# Patient Record
Sex: Male | Born: 1985 | Race: Black or African American | Hispanic: No | Marital: Single | Smoking: Current every day smoker
Health system: Southern US, Community
[De-identification: ages and names within clinical notes are randomized; demographics above are authoritative.]

## PROBLEM LIST (undated history)

## (undated) DIAGNOSIS — K219 Gastro-esophageal reflux disease without esophagitis: Secondary | ICD-10-CM

## (undated) HISTORY — PX: OTHER SURGICAL HISTORY: SHX169

---
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
## 2005-09-01 ENCOUNTER — Emergency Department (HOSPITAL_COMMUNITY): Admission: EM | Admit: 2005-09-01 | Discharge: 2005-09-01 | Payer: Self-pay | Admitting: Emergency Medicine

## 2008-04-23 ENCOUNTER — Emergency Department (HOSPITAL_COMMUNITY): Admission: EM | Admit: 2008-04-23 | Discharge: 2008-04-23 | Payer: Self-pay | Admitting: Emergency Medicine

## 2009-07-14 ENCOUNTER — Emergency Department (HOSPITAL_COMMUNITY): Admission: EM | Admit: 2009-07-14 | Discharge: 2009-07-14 | Payer: Self-pay | Admitting: Emergency Medicine

## 2011-10-24 ENCOUNTER — Emergency Department (HOSPITAL_COMMUNITY): Payer: Self-pay

## 2011-10-24 ENCOUNTER — Encounter (HOSPITAL_COMMUNITY): Payer: Self-pay | Admitting: Emergency Medicine

## 2011-10-24 ENCOUNTER — Emergency Department (HOSPITAL_COMMUNITY)
Admission: EM | Admit: 2011-10-24 | Discharge: 2011-10-24 | Disposition: A | Discharge status: Home / Self Care | Payer: Self-pay | Attending: Emergency Medicine | Admitting: Emergency Medicine

## 2011-10-24 DIAGNOSIS — F17200 Nicotine dependence, unspecified, uncomplicated: Secondary | ICD-10-CM | POA: Insufficient documentation

## 2011-10-24 DIAGNOSIS — Z79899 Other long term (current) drug therapy: Secondary | ICD-10-CM | POA: Insufficient documentation

## 2011-10-24 DIAGNOSIS — R112 Nausea with vomiting, unspecified: Secondary | ICD-10-CM | POA: Insufficient documentation

## 2011-10-24 DIAGNOSIS — R197 Diarrhea, unspecified: Secondary | ICD-10-CM | POA: Insufficient documentation

## 2011-10-24 DIAGNOSIS — J40 Bronchitis, not specified as acute or chronic: Secondary | ICD-10-CM | POA: Insufficient documentation

## 2011-10-24 MED ORDER — DOXYCYCLINE HYCLATE 100 MG PO TABS: 100.0000 mg | ORAL_TABLET | Freq: Two times a day (BID) | ORAL | Status: AC

## 2011-10-24 MED ORDER — ONDANSETRON HCL 4 MG PO TABS: 4.0000 mg | ORAL_TABLET | Freq: Three times a day (TID) | ORAL | Status: AC | PRN

## 2011-10-24 MED ORDER — ONDANSETRON 4 MG PO TBDP
8.0000 mg | ORAL_TABLET | Freq: Once | ORAL | Status: AC
  Administered 2011-10-24: 8 mg via ORAL
  Filled 2011-10-24: qty 2

## 2011-10-24 MED ORDER — DOXYCYCLINE HYCLATE 100 MG PO TABS
100.0000 mg | ORAL_TABLET | Freq: Once | ORAL | Status: AC
  Administered 2011-10-24: 100 mg via ORAL
  Filled 2011-10-24: qty 1

## 2011-10-24 NOTE — ED Provider Notes (Signed)
History  This chart was scribed for Flint Melter, MD by Bennett Scrape. This patient was seen in room STRE5/STRE5 and the patient's care was started at 4:18PM.  CSN: [id number]  Arrival date & time 10/24/11  1519   First MD Initiated Contact with Patient 10/24/11 1618      Chief Complaint  Patient presents with  . Cough    The history is provided by the patient. No language interpreter was used.    Joel Ross is a 26 y.o. male who presents to the Emergency Department complaining of 3 days of gradual onset, gradually worsening, constant productive cough of thick yellow mucus with associated flank pain, diaphoresis, SOB, chills, nausea, emesis, diarrhea and sore throat. The flank pain is worse with coughing. The diarrhea was described as green. Pt states that the last episode of emesis occurred yesterday. Pt states that he had similar but milder symptoms for 2 days approximately 2 weeks ago. He reports trying OTC medications without improvement. He states that he has not been on antibiotics for the symptoms. He denies having any sick contacts with similar symptoms. He has a h/o childhood asthma. He is a current everyday smoker and occasional alcohol user.  No PCP  Past Medical History  Diagnosis Date  . Asthma     childhood    History reviewed. No pertinent past surgical history.  History reviewed. No pertinent family history.  History  Substance Use Topics  . Smoking status: Current Everyday Smoker -- 0.5 packs/day  . Smokeless tobacco: Not on file  . Alcohol Use: Yes     occasion      Review of Systems  A complete 10 system review of systems was obtained and all systems are negative except as noted in the HPI and PMH.   Allergies  Review of patient's allergies indicates no known allergies.  Home Medications   Current Outpatient Rx  Name Route Sig Dispense Refill  . IBUPROFEN 200 MG PO TABS Oral Take 400 mg by mouth daily as needed. For pain    .  DOXYCYCLINE HYCLATE 100 MG PO TABS Oral Take 1 tablet (100 mg total) by mouth 2 (two) times daily. 14 tablet 0  . ONDANSETRON HCL 4 MG PO TABS Oral Take 1 tablet (4 mg total) by mouth every 8 (eight) hours as needed for nausea. 12 tablet 0    Triage Vitals: BP 143/81  Pulse 89  Temp(Src) 98.8 F (37.1 C) (Oral)  Resp 16  SpO2 98%  Physical Exam  Nursing note and vitals reviewed. Constitutional: He is oriented to person, place, and time. He appears well-developed and well-nourished.       Diaphoretic  HENT:  Head: Normocephalic and atraumatic.  Right Ear: External ear normal.  Left Ear: External ear normal.  Eyes: Conjunctivae and EOM are normal. Pupils are equal, round, and reactive to light.  Neck: Normal range of motion and phonation normal. Neck supple.  Cardiovascular: Normal rate, regular rhythm, normal heart sounds and intact distal pulses.   Pulmonary/Chest: Effort normal and breath sounds normal. He exhibits no bony tenderness.       No costovertebral tenderness  Abdominal: Soft. Normal appearance. There is no tenderness.  Musculoskeletal: Normal range of motion.       Mild lumbar tenderness  Neurological: He is alert and oriented to person, place, and time. He has normal strength. No cranial nerve deficit or sensory deficit. He exhibits normal muscle tone. Coordination normal.  Skin: Skin is warm, dry and  intact.  Psychiatric: He has a normal mood and affect. His behavior is normal. Judgment and thought content normal.    ED Course  Procedures (including critical care time)  DIAGNOSTIC STUDIES: Oxygen Saturation is 98% on room air, normal by my interpretation.    COORDINATION OF CARE: 4:28PM-Informed pt of negative chest x-ray. Discussed discharge plan of antinausea medication and antibiotics with pt and pt agreed.   5:32PM-Pt rechecked and states that he feels better. Pt states that he was able to drink some water after the antianausea medications. Will discharge pt  with a work note.  Labs Reviewed - No data to display Dg Chest 2 View  10/24/2011  *RADIOLOGY REPORT*  Clinical Data: Cough  CHEST - 2 VIEW  Comparison: None.  Findings: Heart is normal in size.  Lungs are well aerated and clear.  Mild bronchitic changes.  No pneumothorax and no pleural fluid.  IMPRESSION: No active cardiopulmonary disease.  Original Report Authenticated By: Donavan Burnet, M.D.     1. Bronchitis   2. Nausea & vomiting       MDM  Cough with Bronchospasm, eval. C/w bronchitis; doubt PNE, PE, ACS, metabolic instability. Pt stable for d/c     I personally performed the services described in this documentation, which was scribed in my presence. The recorded information has been reviewed and considered.     Flint Melter, MD 10/26/11 [phone]

## 2011-10-24 NOTE — Discharge Instructions (Signed)
Bronchitis Bronchitis is the body's way of reacting to injury and/or infection (inflammation) of the bronchi. Bronchi are the air tubes that extend from the windpipe into the lungs. If the inflammation becomes severe, it may cause shortness of breath. CAUSES  Inflammation may be caused by:  A virus.   Germs (bacteria).   Dust.   Allergens.   Pollutants and many other irritants.  The cells lining the bronchial tree are covered with tiny hairs (cilia). These constantly beat upward, away from the lungs, toward the mouth. This keeps the lungs free of pollutants. When these cells become too irritated and are unable to do their job, mucus begins to develop. This causes the characteristic cough of bronchitis. The cough clears the lungs when the cilia are unable to do their job. Without either of these protective mechanisms, the mucus would settle in the lungs. Then you would develop pneumonia. Smoking is a common cause of bronchitis and can contribute to pneumonia. Stopping this habit is the single most important thing you can do to help yourself. TREATMENT   Your caregiver may prescribe an antibiotic if the cough is caused by bacteria. Also, medicines that open up your airways make it easier to breathe. Your caregiver may also recommend or prescribe an expectorant. It will loosen the mucus to be coughed up. Only take over-the-counter or prescription medicines for pain, discomfort, or fever as directed by your caregiver.   Removing whatever causes the problem (smoking, for example) is critical to preventing the problem from getting worse.   Cough suppressants may be prescribed for relief of cough symptoms.   Inhaled medicines may be prescribed to help with symptoms now and to help prevent problems from returning.   For those with recurrent (chronic) bronchitis, there may be a need for steroid medicines.  SEEK IMMEDIATE MEDICAL CARE IF:   During treatment, you develop more pus-like mucus  (purulent sputum).   You have a fever.   Your baby is older than 3 months with a rectal temperature of 102 F (38.9 C) or higher.   Your baby is 66 months old or younger with a rectal temperature of 100.4 F (38 C) or higher.   You become progressively more ill.   You have increased difficulty breathing, wheezing, or shortness of breath.  It is necessary to seek immediate medical care if you are elderly or sick from any other disease. MAKE SURE YOU:   Understand these instructions.   Will watch your condition.   Will get help right away if you are not doing well or get worse.  Document Released: 05/26/2005 Document Revised: 05/15/2011 Document Reviewed: 04/04/2008 [facility] Patient Information 2012 [place], [place].Clear Liquid Diet The clear liquid dietconsists of foods that are liquid or will become liquid at room temperature.You should be able to see through the liquid and beverages. Examples of foods allowed on a clear liquid diet include fruit juice, broth or bouillon, gelatin, or frozen ice pops. The purpose of this diet is to provide necessary fluid, electrolytes such as sodium and potassium, and energy to keep the body functioning during times when you are not able to consume a regular diet.A clear liquid diet should not be continued for long periods of time as it is not nutritionally adequate.  REASONS FOR USING A CLEAR LIQUID DIET  In sudden onset (acute) conditions for a patient before or after surgery.   As the first step in oral feeding.   For fluid and electrolyte replacement in diarrheal diseases.  As a diet before certain medical tests are performed.  ADEQUACY The clear liquid diet is adequate only in ascorbic acid, according to the Recommended Dietary Allowances of the [employer]. CHOOSING FOODS Breads and Starches  Allowed:  None are allowed.   Avoid: All are avoided.  Vegetables  Allowed:  Strained tomato or vegetable juice.   Avoid:  Any others.  Fruit  Allowed:  Strained fruit juices and fruit drinks. Include 1 serving of citrus or vitamin C-enriched fruit juice daily.   Avoid: Any others.  Meat and Meat Substitutes  Allowed:  None are allowed.   Avoid: All are avoided.  Milk  Allowed:  None are allowed.   Avoid: All are avoided.  Soups and Combination Foods  Allowed:  Clear bouillon, broth, or strained broth-based soups.   Avoid: Any others.  Desserts and Sweets  Allowed:  Sugar, honey. High protein gelatin. Flavored gelatin, ices, or frozen ice pops that do not contain milk.   Avoid: Any others.  Fats and Oils  Allowed:  None are allowed.   Avoid: All are avoided.  Beverages  Allowed: Cereal beverages, coffee (regular or decaffeinated), tea, or soda at the discretion of your caregiver.   Avoid: Any others.  Condiments  Allowed:  Iodized salt.   Avoid: Any others, including pepper.  Supplements  Allowed:  Liquid nutrition beverages.   Avoid: Any others that contain lactose or fiber.  SAMPLE MEAL PLAN Breakfast  4 oz (120 mL) strained orange juice.    to 1 cup (125 to 250 mL) gelatin (plain or fortified).   1 cup (250 mL) beverage (coffee or tea).   Sugar, if desired.  Midmorning Snack   cup (125 mL) gelatin (plain or fortified).  Lunch  1 cup (250 mL) broth or consomm.   4 oz (120 mL) strained grapefruit juice.    cup (125 mL) gelatin (plain or fortified).   1 cup (250 mL) beverage (coffee or tea).   Sugar, if desired.  Midafternoon Snack   cup (125 mL) fruit ice.    cup (125 mL) strained fruit juice.  Dinner  1 cup (250 mL) broth or consomm.    cup (125 mL) cranberry juice.    cup (125 mL) flavored gelatin (plain or fortified).   1 cup (250 mL) beverage (coffee or tea).   Sugar, if desired.  Evening Snack  4 oz (120 mL) strained apple juice (vitamin C-fortified).    cup (125 mL) flavored gelatin (plain or fortified).  Document Released:  05/26/2005 Document Revised: 05/15/2011 Document Reviewed: 08/23/2010 [facility] Patient Information 2012 [place], [place].

## 2011-10-24 NOTE — ED Notes (Signed)
Reports 2 weeks ago, had what thought was common cold; on Tuesday, has started to get worse; reports productive cough-thick yellow, SOB, chills, sore throat; pt has tried OTC meds without relief, has not been on abx

## 2013-01-25 ENCOUNTER — Emergency Department (HOSPITAL_COMMUNITY)
Admission: EM | Admit: 2013-01-25 | Discharge: 2013-01-25 | Disposition: A | Discharge status: Home / Self Care | Payer: Self-pay | Attending: Emergency Medicine | Admitting: Emergency Medicine

## 2013-01-25 ENCOUNTER — Emergency Department (HOSPITAL_COMMUNITY): Payer: Self-pay

## 2013-01-25 ENCOUNTER — Encounter (HOSPITAL_COMMUNITY): Payer: Self-pay | Admitting: Radiology

## 2013-01-25 DIAGNOSIS — K611 Rectal abscess: Secondary | ICD-10-CM

## 2013-01-25 DIAGNOSIS — K612 Anorectal abscess: Secondary | ICD-10-CM | POA: Insufficient documentation

## 2013-01-25 DIAGNOSIS — K6289 Other specified diseases of anus and rectum: Secondary | ICD-10-CM | POA: Insufficient documentation

## 2013-01-25 DIAGNOSIS — F17200 Nicotine dependence, unspecified, uncomplicated: Secondary | ICD-10-CM | POA: Insufficient documentation

## 2013-01-25 DIAGNOSIS — J45909 Unspecified asthma, uncomplicated: Secondary | ICD-10-CM | POA: Insufficient documentation

## 2013-01-25 DIAGNOSIS — Z79899 Other long term (current) drug therapy: Secondary | ICD-10-CM | POA: Insufficient documentation

## 2013-01-25 LAB — CBC WITH DIFFERENTIAL/PLATELET
Eosinophils Absolute: 0.1 10*3/uL (ref 0.0–0.7)
Eosinophils Relative: 1 % (ref 0–5)
Hemoglobin: 14 g/dL (ref 13.0–17.0)
Lymphocytes Relative: 17 % (ref 12–46)
Lymphs Abs: 1.8 10*3/uL (ref 0.7–4.0)
MCH: 30.4 pg (ref 26.0–34.0)
MCV: 90.7 fL (ref 78.0–100.0)
Monocytes Relative: 12 % (ref 3–12)
Neutrophils Relative %: 70 % (ref 43–77)
Platelets: 252 10*3/uL (ref 150–400)
RBC: 4.6 MIL/uL (ref 4.22–5.81)
WBC: 10.7 10*3/uL — ABNORMAL HIGH (ref 4.0–10.5)

## 2013-01-25 LAB — BASIC METABOLIC PANEL
BUN: 14 mg/dL (ref 6–23)
CO2: 29 mEq/L (ref 19–32)
Glucose, Bld: 112 mg/dL — ABNORMAL HIGH (ref 70–99)
Potassium: 3.7 mEq/L (ref 3.5–5.1)
Sodium: 138 mEq/L (ref 135–145)

## 2013-01-25 MED ORDER — SULFAMETHOXAZOLE-TRIMETHOPRIM 800-160 MG PO TABS: 1.0000 | ORAL_TABLET | Freq: Two times a day (BID) | ORAL | Status: DC

## 2013-01-25 MED ORDER — OXYCODONE-ACETAMINOPHEN 5-325 MG PO TABS
2.0000 | ORAL_TABLET | Freq: Once | ORAL | Status: AC
  Administered 2013-01-25: 2 via ORAL
  Filled 2013-01-25: qty 2

## 2013-01-25 MED ORDER — HYDROCODONE-ACETAMINOPHEN 5-325 MG PO TABS: 1.0000 | ORAL_TABLET | ORAL | Status: DC | PRN

## 2013-01-25 MED ORDER — DOCUSATE SODIUM 100 MG PO CAPS: 100.0000 mg | ORAL_CAPSULE | Freq: Two times a day (BID) | ORAL | Status: DC

## 2013-01-25 MED ORDER — IOHEXOL 300 MG/ML  SOLN
50.0000 mL | Freq: Once | INTRAMUSCULAR | Status: AC | PRN
  Administered 2013-01-25: 50 mL via INTRAVENOUS

## 2013-01-25 MED ORDER — IOHEXOL 300 MG/ML  SOLN
100.0000 mL | Freq: Once | INTRAMUSCULAR | Status: AC | PRN
  Administered 2013-01-25: 100 mL via INTRAVENOUS

## 2013-01-25 NOTE — ED Provider Notes (Signed)
CSN: [id number]     Arrival date & time 01/25/13  1015 History     First MD Initiated Contact with Patient 01/25/13 1052     Chief Complaint  Patient presents with  . Abscess    HPI Patient reports worsening rectal pain over the past 3-4 days.  He denies fevers or chills.  No history of abscesses.  His pain is mild to moderate in severity.  Nothing improves his pain.  No nausea or vomiting.  No abdominal pain.  No melena or hematochezia.   Past Medical History  Diagnosis Date  . Asthma     childhood   No past surgical history on file. No family history on file. History  Substance Use Topics  . Smoking status: Current Every Day Smoker -- 0.50 packs/day  . Smokeless tobacco: Not on file  . Alcohol Use: Yes     Comment: occasion    Review of Systems  All other systems reviewed and are negative.    Allergies  Review of patient's allergies indicates no known allergies.  Home Medications   Current Outpatient Rx  Name  Route  Sig  Dispense  Refill  . phenylephrine-shark liver oil-mineral oil-petrolatum (PREPARATION H) 0.25-3-14-71.9 % rectal ointment   Rectal   Place 1 application rectally 2 (two) times daily as needed for hemorrhoids.         Marland Kitchen docusate sodium (COLACE) 100 MG capsule   Oral   Take 1 capsule (100 mg total) by mouth every 12 (twelve) hours.   60 capsule   0   . HYDROcodone-acetaminophen (NORCO/VICODIN) 5-325 MG per tablet   Oral   Take 1 tablet by mouth every 4 (four) hours as needed for pain.   15 tablet   0   . sulfamethoxazole-trimethoprim (SEPTRA DS) 800-160 MG per tablet   Oral   Take 1 tablet by mouth 2 (two) times daily.   28 tablet   0    BP 138/79  Pulse 76  Temp(Src) 98.8 F (37.1 C) (Oral)  SpO2 100% Physical Exam  Nursing note and vitals reviewed. Constitutional: He is oriented to person, place, and time. He appears well-developed and well-nourished.  HENT:  Head: Normocephalic and atraumatic.  Eyes: EOM are normal.   Neck: Normal range of motion.  Cardiovascular: Normal rate, regular rhythm, normal heart sounds and intact distal pulses.   Pulmonary/Chest: Effort normal and breath sounds normal.  Abdominal: Soft.  Genitourinary:  Perirectal abscess at approximately 9:00.  Pointing with new purulent drainage.  No surrounding erythema.  Rectal exam performed without significant intrarectal component.  Musculoskeletal: Normal range of motion.  Neurological: He is alert and oriented to person, place, and time.  Skin: Skin is warm and dry.  Psychiatric: He has a normal mood and affect. Judgment normal.    ED Course   Procedures (including critical care time)  Labs Reviewed  CBC WITH DIFFERENTIAL - Abnormal; Notable for the following:    WBC 10.7 (*)    Monocytes Absolute 1.3 (*)    All other components within normal limits  BASIC METABOLIC PANEL - Abnormal; Notable for the following:    Glucose, Bld 112 (*)    GFR calc non Af Amer 80 (*)    All other components within normal limits   Ct Pelvis W Contrast  01/25/2013   *RADIOLOGY REPORT*  Clinical Data:  Evaluate perirectal abscess, rectal pain.  CT PELVIS WITH CONTRAST  Technique:  Multidetector CT imaging of the pelvis was performed using  the standard protocol following the bolus administration of intravenous contrast.  Contrast: 50mL OMNIPAQUE IOHEXOL 300 MG/ML  SOLN, OMNIPAQUE IOHEXOL 300 MG/ML  SOLN  Comparison:   None.  Findings:  Visualized bowel is unremarkable.  Appendix is normal. No pathologically enlarged lymph nodes.  No free fluid.  There is mild eccentric thickening of the perirectal soft tissues, on the right (series 2, image 43).  Possible focal low attenuation in association, measuring 1.2 cm (image 46) stranding is seen in the subcutaneous fat of the medial right buttock.  No worrisome lytic or sclerotic lesions.  IMPRESSION: Asymmetric thickening and possible tiny abscess involving the right perirectal soft tissues.   Original  Report Authenticated By: Leanna Battles, M.D.   I personally reviewed the imaging tests through PACS system I reviewed available ER/hospitalization records through the EMR   1. Perirectal abscess     MDM  The abscess is draining at this time.  No indication for additional incision and drainage.  All the purulent drainage was of the abscess.  The patient be placed on antibiotics.  Home with pain medication.  Home with instructions for warm water soaks.  He understands return to the ER for new or worsening symptoms.  Lyanne Co, MD 01/25/13 [phone]

## 2013-01-25 NOTE — ED Notes (Signed)
Pt has an abscess on his right butt cheek. Pt has never had an abscess her in the past. Onset was 4 days ago.

## 2013-01-25 NOTE — ED Provider Notes (Signed)
MSE was initiated and I personally evaluated the patient and placed orders (if any) at  11:14 AM on January 25, 2013.  The patient appears stable so that the remainder of the MSE may be completed by another provider.  Patient presents emergency department with chief complaint of abscess. He states that he noticed an abscess approximately 4 days ago. He states that he thought it was a hemorrhoid first, and has been using Preparation H with no relief. He denies fevers, chills, nausea, vomiting. He endorses some to pain, but is worsened with sitting and walking. He has never had an abscess before per  PE: Gen: A&O x4 HEENT: PERRL, EOM intact CHEST: RRR, no m/r/g LUNGS: CTAB, no w/r/r ABD: BS x 4, ND/NT GU: Right-sided perirectal abscess, approximately 2 x 2 centimeters EXT: No edema, strong peripheral pulses NEURO: Sensation and strength intact bilaterally  Plan: Moved to acute side, will order basic labs, and CT pelvis to further evaluate perirectal abscess.   Roxy Horseman, PA-C 01/25/13 1115

## 2013-01-25 NOTE — ED Provider Notes (Signed)
Medical screening examination/treatment/procedure(s) were conducted as a shared visit with non-physician practitioner(s) and myself.  I personally evaluated the patient during the encounter  Please see my H and P  Lyanne Co, MD 01/25/13 1139

## 2013-01-25 NOTE — ED Notes (Signed)
Bed: WA08 Expected date:  Expected time:  Means of arrival:  Comments: 

## 2013-11-04 ENCOUNTER — Encounter (HOSPITAL_COMMUNITY)
Admission: EM | Disposition: A | Discharge status: Home / Self Care | Payer: Self-pay | Source: Home / Self Care | Attending: Emergency Medicine

## 2013-11-04 ENCOUNTER — Encounter (HOSPITAL_COMMUNITY): Payer: Self-pay | Admitting: Emergency Medicine

## 2013-11-04 ENCOUNTER — Emergency Department (HOSPITAL_COMMUNITY)
Admission: EM | Admit: 2013-11-04 | Discharge: 2013-11-04 | Disposition: A | Discharge status: Home / Self Care | Payer: Self-pay | Attending: Emergency Medicine | Admitting: Emergency Medicine

## 2013-11-04 ENCOUNTER — Telehealth: Payer: Self-pay | Admitting: Internal Medicine

## 2013-11-04 ENCOUNTER — Emergency Department (HOSPITAL_COMMUNITY): Payer: Self-pay

## 2013-11-04 DIAGNOSIS — Z79899 Other long term (current) drug therapy: Secondary | ICD-10-CM | POA: Insufficient documentation

## 2013-11-04 DIAGNOSIS — T18128A Food in esophagus causing other injury, initial encounter: Secondary | ICD-10-CM

## 2013-11-04 DIAGNOSIS — F17200 Nicotine dependence, unspecified, uncomplicated: Secondary | ICD-10-CM | POA: Insufficient documentation

## 2013-11-04 DIAGNOSIS — Y990 Civilian activity done for income or pay: Secondary | ICD-10-CM | POA: Insufficient documentation

## 2013-11-04 DIAGNOSIS — Y9289 Other specified places as the place of occurrence of the external cause: Secondary | ICD-10-CM | POA: Insufficient documentation

## 2013-11-04 DIAGNOSIS — R109 Unspecified abdominal pain: Secondary | ICD-10-CM | POA: Insufficient documentation

## 2013-11-04 DIAGNOSIS — K222 Esophageal obstruction: Secondary | ICD-10-CM

## 2013-11-04 DIAGNOSIS — IMO0002 Reserved for concepts with insufficient information to code with codable children: Secondary | ICD-10-CM | POA: Insufficient documentation

## 2013-11-04 DIAGNOSIS — T18108A Unspecified foreign body in esophagus causing other injury, initial encounter: Secondary | ICD-10-CM | POA: Insufficient documentation

## 2013-11-04 DIAGNOSIS — R112 Nausea with vomiting, unspecified: Secondary | ICD-10-CM | POA: Insufficient documentation

## 2013-11-04 DIAGNOSIS — Y9389 Activity, other specified: Secondary | ICD-10-CM | POA: Insufficient documentation

## 2013-11-04 DIAGNOSIS — J45909 Unspecified asthma, uncomplicated: Secondary | ICD-10-CM | POA: Insufficient documentation

## 2013-11-04 LAB — CBC
HEMATOCRIT: 42.5 % (ref 39.0–52.0)
Hemoglobin: 15.4 g/dL (ref 13.0–17.0)
MCH: 31.8 pg (ref 26.0–34.0)
MCHC: 36.2 g/dL — ABNORMAL HIGH (ref 30.0–36.0)
MCV: 87.8 fL (ref 78.0–100.0)
PLATELETS: 251 10*3/uL (ref 150–400)
RBC: 4.84 MIL/uL (ref 4.22–5.81)
RDW: 12.9 % (ref 11.5–15.5)
WBC: 9.8 10*3/uL (ref 4.0–10.5)

## 2013-11-04 LAB — BASIC METABOLIC PANEL
BUN: 16 mg/dL (ref 6–23)
CHLORIDE: 97 meq/L (ref 96–112)
CO2: 25 mEq/L (ref 19–32)
Calcium: 9.7 mg/dL (ref 8.4–10.5)
Creatinine, Ser: 1.39 mg/dL — ABNORMAL HIGH (ref 0.50–1.35)
GFR calc non Af Amer: 67 mL/min — ABNORMAL LOW (ref >90)
GFR, EST AFRICAN AMERICAN: 77 mL/min — AB (ref >90)
Glucose, Bld: 101 mg/dL — ABNORMAL HIGH (ref 70–99)
POTASSIUM: 3.4 meq/L — AB (ref 3.7–5.3)
SODIUM: 140 meq/L (ref 137–147)

## 2013-11-04 LAB — I-STAT TROPONIN, ED
TROPONIN I, POC: 0 ng/mL (ref 0.00–0.08)
TROPONIN I, POC: 0 ng/mL (ref 0.00–0.08)

## 2013-11-04 SURGERY — EGD (ESOPHAGOGASTRODUODENOSCOPY)
Anesthesia: Moderate Sedation

## 2013-11-04 MED ORDER — NITROGLYCERIN 0.4 MG SL SUBL
0.4000 mg | SUBLINGUAL_TABLET | SUBLINGUAL | Status: DC | PRN
  Administered 2013-11-04: 0.4 mg via SUBLINGUAL

## 2013-11-04 MED ORDER — ONDANSETRON HCL 4 MG/2ML IJ SOLN
4.0000 mg | Freq: Once | INTRAMUSCULAR | Status: AC
  Administered 2013-11-04: 4 mg via INTRAVENOUS
  Filled 2013-11-04: qty 2

## 2013-11-04 MED ORDER — ONDANSETRON 4 MG PO TBDP
8.0000 mg | ORAL_TABLET | Freq: Once | ORAL | Status: AC
  Administered 2013-11-04: 8 mg via ORAL

## 2013-11-04 MED ORDER — GLUCAGON HCL RDNA (DIAGNOSTIC) 1 MG IJ SOLR
1.0000 mg | Freq: Once | INTRAMUSCULAR | Status: AC
  Administered 2013-11-04: 1 mg via INTRAVENOUS
  Filled 2013-11-04: qty 1

## 2013-11-04 MED ORDER — ONDANSETRON 4 MG PO TBDP
ORAL_TABLET | ORAL | Status: AC
  Filled 2013-11-04: qty 2

## 2013-11-04 MED ORDER — ONDANSETRON HCL 4 MG/2ML IJ SOLN
INTRAMUSCULAR | Status: AC
  Filled 2013-11-04: qty 2

## 2013-11-04 MED ORDER — FENTANYL CITRATE 0.05 MG/ML IJ SOLN
50.0000 ug | Freq: Once | INTRAMUSCULAR | Status: AC
  Administered 2013-11-04: 50 ug via INTRAVENOUS
  Filled 2013-11-04: qty 2

## 2013-11-04 MED ORDER — ASPIRIN 81 MG PO CHEW
324.0000 mg | CHEWABLE_TABLET | Freq: Once | ORAL | Status: AC
  Administered 2013-11-04: 324 mg via ORAL
  Filled 2013-11-04: qty 4

## 2013-11-04 NOTE — ED Notes (Addendum)
C/o epigastric pain, constant, not changing, like pressure building up in mid upper chest & throat, "feels like something stuck, gas or food ", no dyspnea noted, airway intact, handling secretions, no drooling, speech clear, reports vomiting food particles and liquid, last emesis in w/r, has kept down liquids since evening meal of [place] steak.

## 2013-11-04 NOTE — ED Provider Notes (Signed)
CSN: [id number]     Arrival date & time 11/04/13  0113 History   First MD Initiated Contact with Patient 11/04/13 0545     Chief Complaint  Patient presents with  . Chest Pain     (Consider location/radiation/quality/duration/timing/severity/associated sxs/prior Treatment) HPI Comments: Pt has no medical hx. States that he had some food while at work yday afternoon, and soon after started having some discomfort in his throat. He feels that some food is stuck in his food. He is having some trouble swallowing, and when he drinks water, few minutes later he will spit it out. He also has heart burn like feeling. No hx of food impaction in the past. Breathing is normal.  Patient is a 28 y.o. male presenting with chest pain. The history is provided by the patient.  Chest Pain Associated symptoms: abdominal pain, nausea and vomiting   Associated symptoms: no cough and no shortness of breath     Past Medical History  Diagnosis Date  . Asthma     childhood   History reviewed. No pertinent past surgical history. No family history on file. History  Substance Use Topics  . Smoking status: Current Every Day Smoker -- 0.50 packs/day  . Smokeless tobacco: Not on file  . Alcohol Use: Yes     Comment: occasion    Review of Systems  Constitutional: Negative for activity change and appetite change.  Respiratory: Negative for cough and shortness of breath.   Cardiovascular: Negative for chest pain.  Gastrointestinal: Positive for nausea, vomiting and abdominal pain.  Genitourinary: Negative for dysuria.      Allergies  Review of patient's allergies indicates no known allergies.  Home Medications   Prior to Admission medications   Medication Sig Start Date End Date Taking? Authorizing Provider  phenylephrine-shark liver oil-mineral oil-petrolatum (PREPARATION H) 0.25-3-14-71.9 % rectal ointment Place 1 application rectally 2 (two) times daily as needed for hemorrhoids.   Yes Historical  Provider, MD   BP 126/54  Pulse 67  Temp(Src) 98.2 F (36.8 C) (Oral)  Resp 11  Ht 5\' 5"  (1.651 m)  Wt 190 lb (86.183 kg)  BMI 31.62 kg/m2  SpO2 99% Physical Exam  Nursing note and vitals reviewed. Constitutional: He is oriented to person, place, and time. He appears well-developed.  HENT:  Head: Normocephalic and atraumatic.  Eyes: Conjunctivae and EOM are normal. Pupils are equal, round, and reactive to light.  Neck: Normal range of motion. Neck supple.  Cardiovascular: Normal rate and regular rhythm.   Pulmonary/Chest: Effort normal and breath sounds normal.  Abdominal: Soft. Bowel sounds are normal. He exhibits no distension. There is no tenderness. There is no rebound and no guarding.  Neurological: He is alert and oriented to person, place, and time.  Skin: Skin is warm.    ED Course  Procedures (including critical care time) Labs Review Labs Reviewed  CBC - Abnormal; Notable for the following:    MCHC 36.2 (*)    All other components within normal limits  BASIC METABOLIC PANEL - Abnormal; Notable for the following:    Potassium 3.4 (*)    Glucose, Bld 101 (*)    Creatinine, Ser 1.39 (*)    GFR calc non Af Amer 67 (*)    GFR calc Af Amer 77 (*)    All other components within normal limits  I-STAT TROPOININ, ED  Rosezena SensorI-STAT TROPOININ, ED    Imaging Review Dg Chest 2 View  11/04/2013   CLINICAL DATA:  Chest pain, heartburn,  nausea, vomiting, history asthma  EXAM: CHEST  2 VIEW  COMPARISON:  10/24/2011  FINDINGS: Normal heart size, mediastinal contours, and pulmonary vascularity.  Lungs clear.  No pneumothorax.  Bones unremarkable.  IMPRESSION: Normal exam.   Electronically Signed   By: Ulyses Southward M.D.   On: 11/04/2013 02:23     EKG Interpretation   Date/Time:  Friday Nov 04 2013 05:23:13 EDT Ventricular Rate:  77 PR Interval:  139 QRS Duration: 81 QT Interval:  399 QTC Calculation: 452 R Axis:   87 Text Interpretation:  Sinus rhythm Normal intervals normal  axis Confirmed  by Norman Piacentini, MD, Vidur Knust ([id number]) on 11/04/2013 6:21:22 AM      MDM   Final diagnoses:  Esophageal obstruction due to food impaction    Pt comes in with cc of heart burn and feeling that food is stuck in his throat since yday afternoon. GLuicagon and nitro given - no real relief.  GI called - and patient was given a glass of water - that he was able to keep down. GI team wants to see patient in the clinic, for suspected partial impaction. Pt discharged.  Derwood Kaplan, MD 11/04/13 [phone]

## 2013-11-04 NOTE — ED Notes (Signed)
Patient with onset of feeling swelling in his chest and throat.  He states he is having chest pain that increases with swallowing.  Patient with emesis x 5.  Patient admits to etoh and marijuana.  Patient denies taking any pain meds prior to arrival.

## 2013-11-04 NOTE — Telephone Encounter (Signed)
History of possible food impaction. Foreign body sensation in lower chest but when I had ED MD give him water it passed and he did not regurgitate.  Told to dc and place on PPI  We would call and arrange f/u for later today or Monday in our office.

## 2013-11-04 NOTE — ED Notes (Signed)
Patient transported to Endo 

## 2013-11-04 NOTE — ED Notes (Signed)
Dr. Nanavati at [facility] ?

## 2013-11-04 NOTE — Discharge Instructions (Signed)
Please don't eat any solid food. Expect a call from the GI doctors soon, for a possible urgent endoscopy. If they dont call you by noon - call [phone], and inform them that the GI doctor had asked you to be seen urgently when you came to the ER.   Swallowed Foreign Body, Adult You have swallowed an object (foreign body). Once the foreign body has passed through the food tube (esophagus), which leads from the mouth to the stomach, it will usually continue through the body without problems. This is because the point where the esophagus enters into the stomach is the narrowest place through which the foreign body must pass. Sometimes the foreign body gets stuck. The most common type of foreign body obstruction in adults is food impaction. Many times, bones from fish or meat products may become lodged in the esophagus or injure the throat on the way down. When there is an object that obstructs the esophagus, the most obvious symptoms are pain and the inability to swallow normally. In some cases, foreign bodies that can be life threatening are swallowed. Examples of these are certain medications and illicit drugs. Often in these instances, patients are afraid of telling what they swallowed. However, it is extremely important to tell the emergency caregiver what was swallowed because life-saving treatment may be needed.  X-ray exams may be taken to find the location of the foreign body. However, some objects do not show up well or may be too small to be seen on an X-ray image. If the foreign body is too large or too sharp, it may be too dangerous to allow it to pass on its own. You may need to see a caregiver who specializes in the digestive system (gastroenterologist). In a few cases, a specialist may need to remove the object using a method called "endoscopy". This involves passing a thin, soft, flexible tube into the food pipe to locate and remove the object. Follow up with your primary doctor or the referral  you were given by the emergency caregiver. HOME CARE INSTRUCTIONS   If your caregiver says it is safe for you to eat, then only have liquids and soft foods until your symptoms improve.  Once you are eating normally:  Cut food into small pieces.  Remove small bones from food.  Remove large seeds and pits from fruit.  Chew your food well.  Do not talk, laugh, or engage in physical activity while eating or swallowing. SEEK MEDICAL CARE IF:  You develop worsening shortness of breath, uncontrollable coughing, chest pains or high fever, greater than 102 F (38.9 C).  You are unable to eat or drink or you feel that food is getting stuck in your throat.  You have choking symptoms or cannot stop drooling.  You develop abdominal pain, vomiting (especially of blood), or rectal bleeding. MAKE SURE YOU:   Understand these instructions.  Will watch your condition.  Will get help right away if you are not doing well or get worse. Document Released: 11/13/2009 Document Revised: 08/18/2011 Document Reviewed: 11/13/2009 [facility] Patient Information 2014 [place], [place].  Esophageal Stricture The esophagus is the long, narrow tube which carries food and liquid from the mouth to the stomach. Sometimes a part of the esophagus becomes narrow and makes it difficult, painful, or even impossible to swallow. This is called an esophageal stricture.  CAUSES  Common causes of blockage or strictures of the esophagus are:  Exposure of the lower esophagus to the acid from the stomach may  cause narrowing.  Hiatal hernia in which a small part of the stomach bulges up through the diaphragm can cause a narrowing in the bottom of the esophagus.  Scleroderma is a tissue disorder that affects the esophagus and makes swallowing difficult.  Achalasia is an absence of nerves in the lower esophagus and to the esophageal sphincter. This absence of nerves may be congenital (present since birth). This can cause  irregular spasms which do not allow food and fluid through.  Strictures may develop from swallowing materials which damage the esophagus. Examples are acids or alkalis such as lye.  Schatzki's Ring is a narrow ring of non-cancerous tissue which narrows the lower esophagus. The cause of this is unknown.  Growths can block the esophagus. SYMPTOMS  Some of the problems are difficulty swallowing or pain with swallowing. DIAGNOSIS  Your caregiver often suspects this problem by taking a medical history. They will also do a physical exam. They may then take X-rays and/or perform an endoscopy. Endoscopy is an exam in which a tube like a small flexible telescope is used to look at your esophagus.  TREATMENT  One form of treatment is to dilate the narrow area. This means to stretch it.  When this is not successful, chest surgery may be required. This is a much more extensive form of treatment with a longer recovery time. Both of the above treatments make the passage of food and water into the stomach easier. They also make it easier for stomach contents to bubble back into the esophagus. Special medications may be used following the procedure to help prevent further narrowing. Medications may be used to lower the amount of acid in the stomach juice.  SEEK IMMEDIATE MEDICAL CARE IF:   Your swallowing is becoming more painful, difficult, or you are unable to swallow.  You vomit up blood.  You develop black tarry stools.  You develop chills.  You have a fever.  You develop chest or abdominal pain.  You develop shortness of breath, feel lightheaded, or faint. Follow up with medical care as your caregiver suggests. Document Released: 02/03/2006 Document Revised: 08/18/2011 Document Reviewed: 03/12/2006 [facility] Patient Information 2014 [place], [place].

## 2013-11-04 NOTE — Telephone Encounter (Signed)
Patient will come in on Monday and see Willette Cluster RNP at 2:30

## 2013-11-04 NOTE — Telephone Encounter (Signed)
Left message for patient to call back  

## 2013-11-04 NOTE — ED Notes (Addendum)
BP dropped after ntg and fentanyl, became diaphoretic. Remains alert, NAD, calm, interactive, reports pain decreased. Denies any use of ED meds or cocaine/ drug use. Will continue to monitor. Dr. Rhunette Croft notified.

## 2013-11-04 NOTE — ED Notes (Signed)
No change. Remains alert, NAD, calm, interactive, speech clear. Pain and nausea remain.

## 2013-11-04 NOTE — ED Notes (Signed)
Dr. Rhunette Croft back at [facility] after PO challenge.

## 2013-11-07 ENCOUNTER — Encounter: Payer: Self-pay | Admitting: Nurse Practitioner

## 2013-11-07 ENCOUNTER — Ambulatory Visit (INDEPENDENT_AMBULATORY_CARE_PROVIDER_SITE_OTHER): Payer: Self-pay | Admitting: Nurse Practitioner

## 2013-11-07 VITALS — BP 118/78 | HR 76 | Ht 65.0 in | Wt 192.2 lb

## 2013-11-07 DIAGNOSIS — R1319 Other dysphagia: Secondary | ICD-10-CM

## 2013-11-07 DIAGNOSIS — K219 Gastro-esophageal reflux disease without esophagitis: Secondary | ICD-10-CM

## 2013-11-07 NOTE — Patient Instructions (Signed)
You have been given a separate informational sheet regarding your tobacco use, the importance of quitting and local resources to help you quit.  

## 2013-11-07 NOTE — Progress Notes (Signed)
    HPI :  Patient is a 28 year old male referred by ED for dysphagia. Patient was eating salisbury steak 3-4 days ago during which time he felt like a piece of the steak got stuck in his esophagus. He was eating more rapidly than usual.  Patient vomited later in the day and developed some chest discomfort. He was evaluated in the emergency department where a chest x-ray and labs were unremarkable except for mildly elevated creatinine (acute). This has never happened to the patient before. He does give a one-year history of heartburn. Her blood occurs about once a week but is relieved by eating a teaspoon of mustard.   Past Medical History  Diagnosis Date  . Asthma     childhood    Family History  Problem Relation Age of Onset  . Heart attack Father 50    died  . Diabetes Father   . Colon cancer Neg Hx   . Diabetes      both sides of fx   History  Substance Use Topics  . Smoking status: Current Every Day Smoker -- 0.50 packs/day for 10 years    Types: Cigarettes  . Smokeless tobacco: Never Used     Comment: Tobacco info given 11/07/13  . Alcohol Use: Yes     Comment: occasion   No current outpatient prescriptions on file.   No current facility-administered medications for this visit.   No Known Allergies   Review of Systems: Positive for headaches . All other systems reviewed and negative except where noted in HPI.    Dg Chest 2 View  11/04/2013   CLINICAL DATA:  Chest pain, heartburn, nausea, vomiting, history asthma  EXAM: CHEST  2 VIEW  COMPARISON:  10/24/2011  FINDINGS: Normal heart size, mediastinal contours, and pulmonary vascularity.  Lungs clear.  No pneumothorax.  Bones unremarkable.  IMPRESSION: Normal exam.   Electronically Signed   By: Ulyses Southward M.D.   On: 11/04/2013 02:23    Physical Exam: BP 118/78  Pulse 76  Ht 5\' 5"  (1.651 m)  Wt 192 lb 3.2 oz (87.181 kg)  BMI 31.98 kg/m2 Constitutional: Pleasant,well-developed, black male in no acute  distress. HEENT: Normocephalic and atraumatic. Conjunctivae are normal. No scleral icterus. Neck supple.  Cardiovascular: Normal rate, regular rhythm.  Pulmonary/chest: Effort normal and breath sounds normal. No wheezing, rales or rhonchi. Abdominal: Soft, nondistended, nontender. Bowel sounds active throughout. There are no masses palpable. No hepatomegaly. Extremities: no edema Lymphadenopathy: No cervical adenopathy noted. Neurological: Alert and oriented to person place and time. Skin: Skin is warm and dry. No rashes noted. Psychiatric: Normal mood and affect. Behavior is normal.   ASSESSMENT AND PLAN: 56. 28 year-old white male with isolated episode of dysphagia while rapidly eating steak. This occurred a couple days ago, he has not tried anything other than egg drop soup sinse. No odynophagia. No history of similar events. At this point encouraged patient to advance to a soft diet. If patient has any recurrent dysphagia he will call so that we can arrange a barium swallow with tablet to rule out ring / stricture of esophagus  2. One-year history of GERD. Patient gets pyrosis about once a week. Symptoms alleviated with a teaspoon of mustard. He does not like to take medications. I advised patient to call if he begins to have more frequent heartburn at which point he will need PPI or at least H2 blocker therarpy.

## 2013-11-08 DIAGNOSIS — R1319 Other dysphagia: Secondary | ICD-10-CM | POA: Insufficient documentation

## 2013-11-08 DIAGNOSIS — K219 Gastro-esophageal reflux disease without esophagitis: Secondary | ICD-10-CM | POA: Insufficient documentation

## 2013-11-08 NOTE — Progress Notes (Signed)
Reviewed and would advise proceeding with BA esophagram with tablet now.  Otherwise agree with management plan.  Venita Lick. Russella Dar, MD [place]

## 2014-01-06 ENCOUNTER — Telehealth: Payer: Self-pay | Admitting: *Deleted

## 2014-01-06 NOTE — Telephone Encounter (Signed)
Message copied by Daphine DeutscherMILLER, [place] Karl N on Fri Jan 06, 2014  2:39 PM ------      Message from: Meredith PelGUENTHER, PAULA M      Created: Wed Jan 04, 2014  1:51 PM       Rene KocherRegina, I saw this patient a few months back. He never called back with further swallowing problems but can you just check in and may sure he isn't having swallowing issues? Thanks ------

## 2014-01-06 NOTE — Telephone Encounter (Signed)
Left a message for patient to call back. 

## 2014-01-09 NOTE — Telephone Encounter (Signed)
Left a message for patient to call back. 

## 2014-01-11 NOTE — Telephone Encounter (Signed)
Unable to reach patient.

## 2014-03-28 ENCOUNTER — Emergency Department (HOSPITAL_COMMUNITY): Payer: Self-pay

## 2014-03-28 ENCOUNTER — Emergency Department (HOSPITAL_COMMUNITY)
Admission: EM | Admit: 2014-03-28 | Discharge: 2014-03-28 | Disposition: A | Discharge status: Home / Self Care | Payer: Self-pay | Attending: Emergency Medicine | Admitting: Emergency Medicine

## 2014-03-28 ENCOUNTER — Encounter (HOSPITAL_COMMUNITY): Payer: Self-pay | Admitting: Emergency Medicine

## 2014-03-28 DIAGNOSIS — Z720 Tobacco use: Secondary | ICD-10-CM | POA: Insufficient documentation

## 2014-03-28 DIAGNOSIS — R1013 Epigastric pain: Secondary | ICD-10-CM | POA: Insufficient documentation

## 2014-03-28 DIAGNOSIS — R142 Eructation: Secondary | ICD-10-CM | POA: Insufficient documentation

## 2014-03-28 DIAGNOSIS — J45909 Unspecified asthma, uncomplicated: Secondary | ICD-10-CM | POA: Insufficient documentation

## 2014-03-28 LAB — COMPREHENSIVE METABOLIC PANEL
ALBUMIN: 4.6 g/dL (ref 3.5–5.2)
ALT: 28 U/L (ref 0–53)
AST: 31 U/L (ref 0–37)
Alkaline Phosphatase: 91 U/L (ref 39–117)
Anion gap: 14 (ref 5–15)
BUN: 11 mg/dL (ref 6–23)
CO2: 27 mEq/L (ref 19–32)
CREATININE: 1.48 mg/dL — AB (ref 0.50–1.35)
Calcium: 9.9 mg/dL (ref 8.4–10.5)
Chloride: 100 mEq/L (ref 96–112)
GFR calc Af Amer: 72 mL/min — ABNORMAL LOW (ref >90)
GFR, EST NON AFRICAN AMERICAN: 62 mL/min — AB (ref >90)
Glucose, Bld: 129 mg/dL — ABNORMAL HIGH (ref 70–99)
Potassium: 4 mEq/L (ref 3.7–5.3)
Sodium: 141 mEq/L (ref 137–147)
Total Bilirubin: 0.4 mg/dL (ref 0.3–1.2)
Total Protein: 8.2 g/dL (ref 6.0–8.3)

## 2014-03-28 LAB — CBC WITH DIFFERENTIAL/PLATELET
BASOS ABS: 0 10*3/uL (ref 0.0–0.1)
BASOS PCT: 1 % (ref 0–1)
EOS PCT: 1 % (ref 0–5)
Eosinophils Absolute: 0.1 10*3/uL (ref 0.0–0.7)
HEMATOCRIT: 44.5 % (ref 39.0–52.0)
HEMOGLOBIN: 15.6 g/dL (ref 13.0–17.0)
Lymphocytes Relative: 19 % (ref 12–46)
Lymphs Abs: 1.3 10*3/uL (ref 0.7–4.0)
MCH: 30.7 pg (ref 26.0–34.0)
MCHC: 35.1 g/dL (ref 30.0–36.0)
MCV: 87.6 fL (ref 78.0–100.0)
Monocytes Absolute: 0.7 10*3/uL (ref 0.1–1.0)
Monocytes Relative: 11 % (ref 3–12)
Neutro Abs: 4.4 10*3/uL (ref 1.7–7.7)
Neutrophils Relative %: 68 % (ref 43–77)
Platelets: 288 10*3/uL (ref 150–400)
RBC: 5.08 MIL/uL (ref 4.22–5.81)
RDW: 12.4 % (ref 11.5–15.5)
WBC: 6.5 10*3/uL (ref 4.0–10.5)

## 2014-03-28 LAB — LIPASE, BLOOD: LIPASE: 13 U/L (ref 11–59)

## 2014-03-28 MED ORDER — ALUM & MAG HYDROXIDE-SIMETH 200-200-20 MG/5ML PO SUSP
30.0000 mL | Freq: Once | ORAL | Status: AC
  Administered 2014-03-28: 30 mL via ORAL
  Filled 2014-03-28: qty 30

## 2014-03-28 MED ORDER — SODIUM CHLORIDE 0.9 % IV BOLUS (SEPSIS)
1000.0000 mL | Freq: Once | INTRAVENOUS | Status: AC
  Administered 2014-03-28: 1000 mL via INTRAVENOUS

## 2014-03-28 MED ORDER — ONDANSETRON HCL 4 MG/2ML IJ SOLN
4.0000 mg | Freq: Once | INTRAMUSCULAR | Status: AC
  Administered 2014-03-28: 4 mg via INTRAVENOUS
  Filled 2014-03-28: qty 2

## 2014-03-28 MED ORDER — PANTOPRAZOLE SODIUM 20 MG PO TBEC: 40.0000 mg | DELAYED_RELEASE_TABLET | Freq: Two times a day (BID) | ORAL | Status: AC

## 2014-03-28 MED ORDER — FAMOTIDINE IN NACL 20-0.9 MG/50ML-% IV SOLN
20.0000 mg | Freq: Once | INTRAVENOUS | Status: AC
  Administered 2014-03-28: 20 mg via INTRAVENOUS
  Filled 2014-03-28: qty 50

## 2014-03-28 NOTE — ED Provider Notes (Signed)
CSN: [id number]     Arrival date & time 03/28/14  0225 History   First MD Initiated Contact with Patient 03/28/14 0236     Chief Complaint  Patient presents with  . Emesis  . Heartburn     (Consider location/radiation/quality/duration/timing/severity/associated sxs/prior Treatment) HPI Joel Ross is a 28 y.o. male with past medical history of asthma coming in with vomiting. Patient states this has occurred for the past week. He only vomits when he takes in food or water. He is unable to tolerate anything. He states he does have a heartburn sensation in his mid chest which radiates up to his throat. His burning sensation. He also feels like there is gas in his chest and he is to let us left side in order to belch. Dry cough during the interval. He has been taking his mothers almost resolved which did provide some relief. Patient also missed the cocaine use over the last week. He last used this past afternoon. He's had no fevers or recent infections. Patient has no further complaints.  10 Systems reviewed and are negative for acute change except as noted in the HPI.     Past Medical History  Diagnosis Date  . Asthma     childhood   Past Surgical History  Procedure Laterality Date  . Neg hx     Family History  Problem Relation Age of Onset  . Heart attack Father [id number]    died  . Diabetes Father   . Colon cancer Neg Hx   . Diabetes      both sides of fx   History  Substance Use Topics  . Smoking status: Current Every Day Smoker -- 1.00 packs/day for 10 years    Types: Cigarettes  . Smokeless tobacco: Never Used     Comment: Tobacco info given 11/07/13  . Alcohol Use: Yes     Comment: occasion    Review of Systems    Allergies  Review of patient's allergies indicates no known allergies.  Home Medications   Prior to Admission medications   Not on File   BP 144/89  Pulse 117  Temp(Src) 98.5 F (36.9 C) (Oral)  Resp 18  Ht 5\' 5"  (1.651 m)  Wt 192 lb (87.091  kg)  BMI 31.95 kg/m2  SpO2 100% Physical Exam  Nursing note and vitals reviewed. Constitutional: He is oriented to person, place, and time. Vital signs are normal. He appears well-developed and well-nourished.  Non-toxic appearance. He does not appear ill. No distress.  HENT:  Head: Normocephalic and atraumatic.  Nose: Nose normal.  Mouth/Throat: Oropharynx is clear and moist. No oropharyngeal exudate.  Eyes: Conjunctivae and EOM are normal. Pupils are equal, round, and reactive to light. No scleral icterus.  Neck: Normal range of motion. Neck supple. No tracheal deviation, no edema, no erythema and normal range of motion present. No mass and no thyromegaly present.  Cardiovascular: Normal rate, regular rhythm, S1 normal, S2 normal, normal heart sounds, intact distal pulses and normal pulses.  Exam reveals no gallop and no friction rub.   No murmur heard. Pulses:      Radial pulses are 2+ on the right side, and 2+ on the left side.       Dorsalis pedis pulses are 2+ on the right side, and 2+ on the left side.  Pulmonary/Chest: Effort normal and breath sounds normal. No respiratory distress. He has no wheezes. He has no rhonchi. He has no rales.  Abdominal: Soft. Normal appearance and  bowel sounds are normal. He exhibits no distension, no ascites and no mass. There is no hepatosplenomegaly. There is no tenderness. There is no rebound, no guarding and no CVA tenderness.  Musculoskeletal: Normal range of motion. He exhibits no edema and no tenderness.  Lymphadenopathy:    He has no cervical adenopathy.  Neurological: He is alert and oriented to person, place, and time. He has normal strength. No cranial nerve deficit or sensory deficit. He exhibits normal muscle tone. GCS eye subscore is 4. GCS verbal subscore is 5. GCS motor subscore is 6.  Skin: Skin is warm, dry and intact. No petechiae and no rash noted. He is not diaphoretic. No erythema. No pallor.  Psychiatric: He has a normal mood and  affect. His behavior is normal. Judgment normal.    ED Course  Procedures (including critical care time) Labs Review Labs Reviewed  COMPREHENSIVE METABOLIC PANEL - Abnormal; Notable for the following:    Glucose, Bld 129 (*)    Creatinine, Ser 1.48 (*)    GFR calc non Af Amer 62 (*)    GFR calc Af Amer 72 (*)    All other components within normal limits  CBC WITH DIFFERENTIAL  LIPASE, BLOOD    Imaging Review Dg Chest 2 View  03/28/2014   CLINICAL DATA:  Heartburn. Left chest pain. Spitting up [employer]itiesBlack stuff for 1 week.  EXAM: CHEST  2 VIEW  COMPARISON:  11/04/2013  FINDINGS: Normal heart size and pulmonary vascularity. No focal airspace disease or consolidation in the lungs. No blunting of costophrenic angles. No pneumothorax. Mediastinal contours appear intact. Old fracture deformity of the right distal clavicle. No change since prior study.  IMPRESSION: No active cardiopulmonary disease.   Electronically Signed   By: Burman NievesWilliam  Stevens M.D.   On: 03/28/2014 03:50     EKG Interpretation   Date/Time:  Tuesday March 28 2014 03:12:13 EDT Ventricular Rate:  97 PR Interval:  134 QRS Duration: 87 QT Interval:  348 QTC Calculation: 442 R Axis:   88 Text Interpretation:  Sinus rhythm No significant change since last  tracing Confirmed by Erroll Lunani, Zyshawn Bohnenkamp [place]le [phone]([id number]) on 03/28/2014 3:16:14  AM      MDM   Final diagnoses:  None    Patient presents emergency department for concern for vomiting. He states he feels like this is his heartburn. We'll give IV fluids, Zofran, famotidine, Maalox. Will obtain chest x-ray to evaluate for any free air.  Upon my repeat evaluation the patient's heartburn symptoms have significantly improved. EKG is nonischemic.  He states he feels like he continues to have gas and stomach, and if he leans to the left then he can burp it up.  Patient has had regular bowel movements every day, I have low suspicion for small bowel obstruction. No free air was seen  on chest x-ray. His cocaine use also could cause nausea and vomiting to this extent. His kidney function is slightly elevated, possibly due to dehydration. Patient is advised to followup with her regular Dr. within 3 days for continued treatment and possible GI followup. He demonstrated understanding and is amenable to this plan.   Tomasita CrumbleAdeleke Zabdiel Dripps, MD 03/28/14 [phone]

## 2014-03-28 NOTE — Discharge Instructions (Signed)
Nausea and Vomiting Joel Ross, take medication as prescribed and follow up with a primary care doctor for continued evaluation of your vomiting and gas.  If symptoms worsen, come back to the ED for repeat evaluation.  Thank you. Nausea means you feel sick to your stomach. Throwing up (vomiting) is a reflex where stomach contents come out of your mouth. HOME CARE   Take medicine as told by your doctor.  Do not force yourself to eat. However, you do need to drink fluids.  If you feel like eating, eat a normal diet as told by your doctor.  Eat rice, wheat, potatoes, bread, lean meats, yogurt, fruits, and vegetables.  Avoid high-fat foods.  Drink enough fluids to keep your pee (urine) clear or pale yellow.  Ask your doctor how to replace body fluid losses (rehydrate). Signs of body fluid loss (dehydration) include:  Feeling very thirsty.  Dry lips and mouth.  Feeling dizzy.  Dark pee.  Peeing less than normal.  Feeling confused.  Fast breathing or heart rate. GET HELP RIGHT AWAY IF:   You have blood in your throw up.  You have black or bloody poop (stool).  You have a bad headache or stiff neck.  You feel confused.  You have bad belly (abdominal) pain.  You have chest pain or trouble breathing.  You do not pee at least once every 8 hours.  You have cold, clammy skin.  You keep throwing up after 24 to 48 hours.  You have a fever. MAKE SURE YOU:   Understand these instructions.  Will watch your condition.  Will get help right away if you are not doing well or get worse. Document Released: 11/12/2007 Document Revised: 08/18/2011 Document Reviewed: 10/25/2010 [facility] Patient Information 2015 [place], [place]. This information is not intended to replace advice given to you by your health care provider. Make sure you discuss any questions you have with your health care provider.

## 2014-03-28 NOTE — ED Notes (Signed)
Pt c/o vomiting, heartburn, and diarrhea for a week. Pt denies fever at home. Denies being around anybody sick.

## 2014-03-28 NOTE — ED Notes (Signed)
Pt states that he has taken omeprazole with no relief.

## 2014-03-30 ENCOUNTER — Emergency Department (HOSPITAL_COMMUNITY)
Admission: EM | Admit: 2014-03-30 | Discharge: 2014-03-30 | Discharge status: Against Medical Advice | Payer: Self-pay | Attending: Emergency Medicine | Admitting: Emergency Medicine

## 2014-03-30 ENCOUNTER — Encounter (HOSPITAL_COMMUNITY): Payer: Self-pay | Admitting: Emergency Medicine

## 2014-03-30 DIAGNOSIS — R002 Palpitations: Secondary | ICD-10-CM | POA: Insufficient documentation

## 2014-03-30 DIAGNOSIS — J45909 Unspecified asthma, uncomplicated: Secondary | ICD-10-CM | POA: Insufficient documentation

## 2014-03-30 DIAGNOSIS — Z720 Tobacco use: Secondary | ICD-10-CM | POA: Insufficient documentation

## 2014-03-30 DIAGNOSIS — R42 Dizziness and giddiness: Secondary | ICD-10-CM | POA: Insufficient documentation

## 2014-03-30 HISTORY — DX: Gastro-esophageal reflux disease without esophagitis: K21.9

## 2014-03-30 LAB — CBC
HCT: 42.1 % (ref 39.0–52.0)
HEMOGLOBIN: 14.7 g/dL (ref 13.0–17.0)
MCH: 31.2 pg (ref 26.0–34.0)
MCHC: 34.9 g/dL (ref 30.0–36.0)
MCV: 89.4 fL (ref 78.0–100.0)
Platelets: 275 10*3/uL (ref 150–400)
RBC: 4.71 MIL/uL (ref 4.22–5.81)
RDW: 12.4 % (ref 11.5–15.5)
WBC: 7.2 10*3/uL (ref 4.0–10.5)

## 2014-03-30 LAB — BASIC METABOLIC PANEL
Anion gap: 14 (ref 5–15)
BUN: 9 mg/dL (ref 6–23)
CO2: 26 mEq/L (ref 19–32)
Calcium: 9.4 mg/dL (ref 8.4–10.5)
Chloride: 99 mEq/L (ref 96–112)
Creatinine, Ser: 1.28 mg/dL (ref 0.50–1.35)
GFR, EST AFRICAN AMERICAN: 86 mL/min — AB (ref >90)
GFR, EST NON AFRICAN AMERICAN: 74 mL/min — AB (ref >90)
Glucose, Bld: 105 mg/dL — ABNORMAL HIGH (ref 70–99)
POTASSIUM: 3.1 meq/L — AB (ref 3.7–5.3)
Sodium: 139 mEq/L (ref 137–147)

## 2014-03-30 LAB — I-STAT TROPONIN, ED: TROPONIN I, POC: 0 ng/mL (ref 0.00–0.08)

## 2014-03-30 NOTE — ED Notes (Signed)
Pt left AMA.  Does not want to be seen not advising he is feeling better and he has a appt to see his PCP

## 2014-03-30 NOTE — ED Notes (Signed)
Pt reports acute onset of palpitations and dizziness x2430min pta - admits to cocaine use and liquor consumption this evening as well. Pt also w/ recent hx of n/v however does not associate n/v w/ new symptoms. Pt admits to recently being evaluated at this facility for heartburn and n/v. Pt appears anxious.

## 2014-05-22 ENCOUNTER — Emergency Department (HOSPITAL_COMMUNITY)
Admission: EM | Admit: 2014-05-22 | Discharge: 2014-05-22 | Discharge status: Against Medical Advice | Payer: Self-pay | Attending: Emergency Medicine | Admitting: Emergency Medicine

## 2014-05-22 DIAGNOSIS — R51 Headache: Secondary | ICD-10-CM | POA: Insufficient documentation

## 2014-05-22 DIAGNOSIS — Z720 Tobacco use: Secondary | ICD-10-CM | POA: Insufficient documentation

## 2014-05-22 DIAGNOSIS — J45909 Unspecified asthma, uncomplicated: Secondary | ICD-10-CM | POA: Insufficient documentation

## 2016-01-05 IMAGING — CR DG CHEST 2V
2 series · 2 of 2 positions shown · non-contrast
Comparison: 11/04/2013

CLINICAL DATA: Heartburn. Left chest pain. Spitting up Black stuff
for 1 week.

EXAM:
CHEST  2 VIEW

[w chest pa]
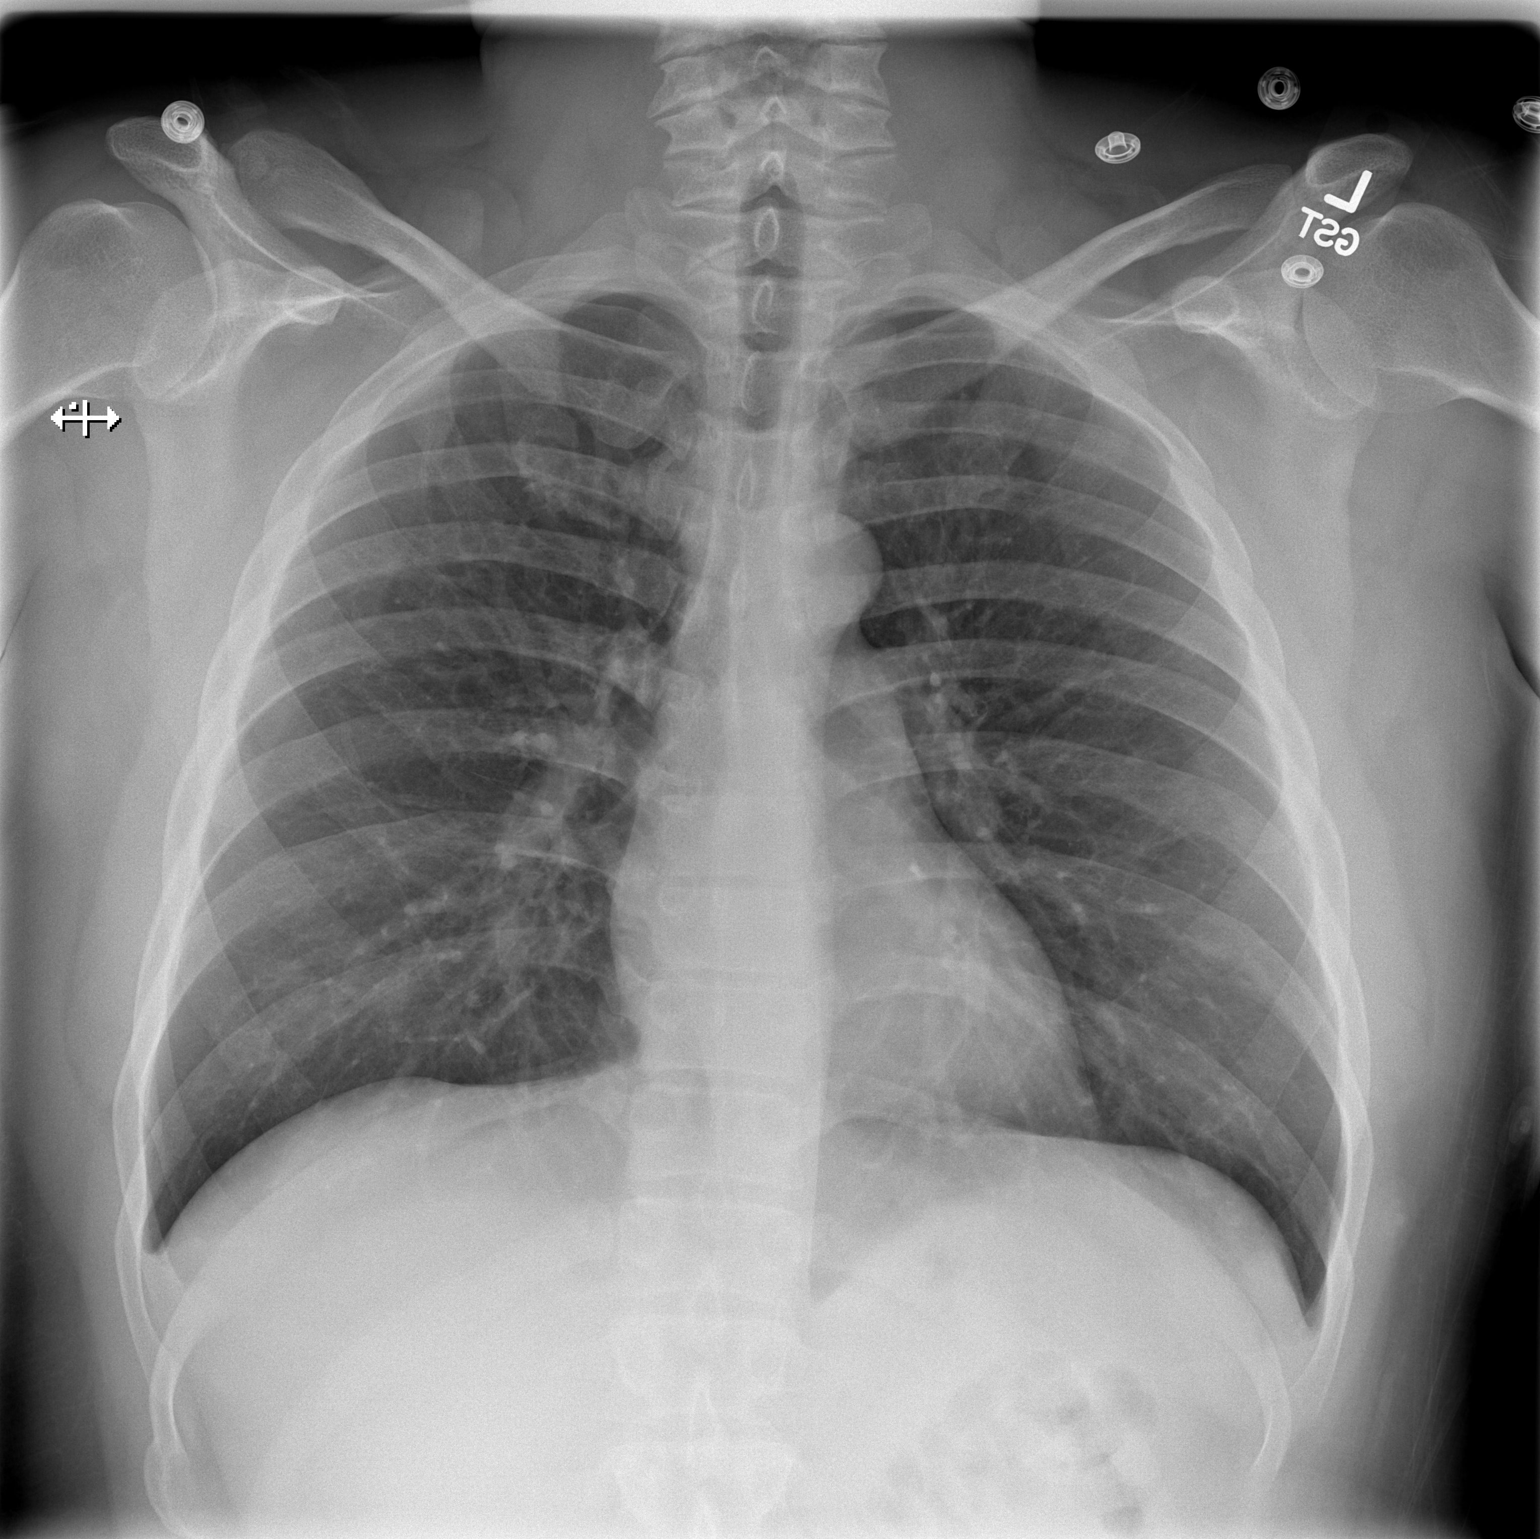

[w chest lat]
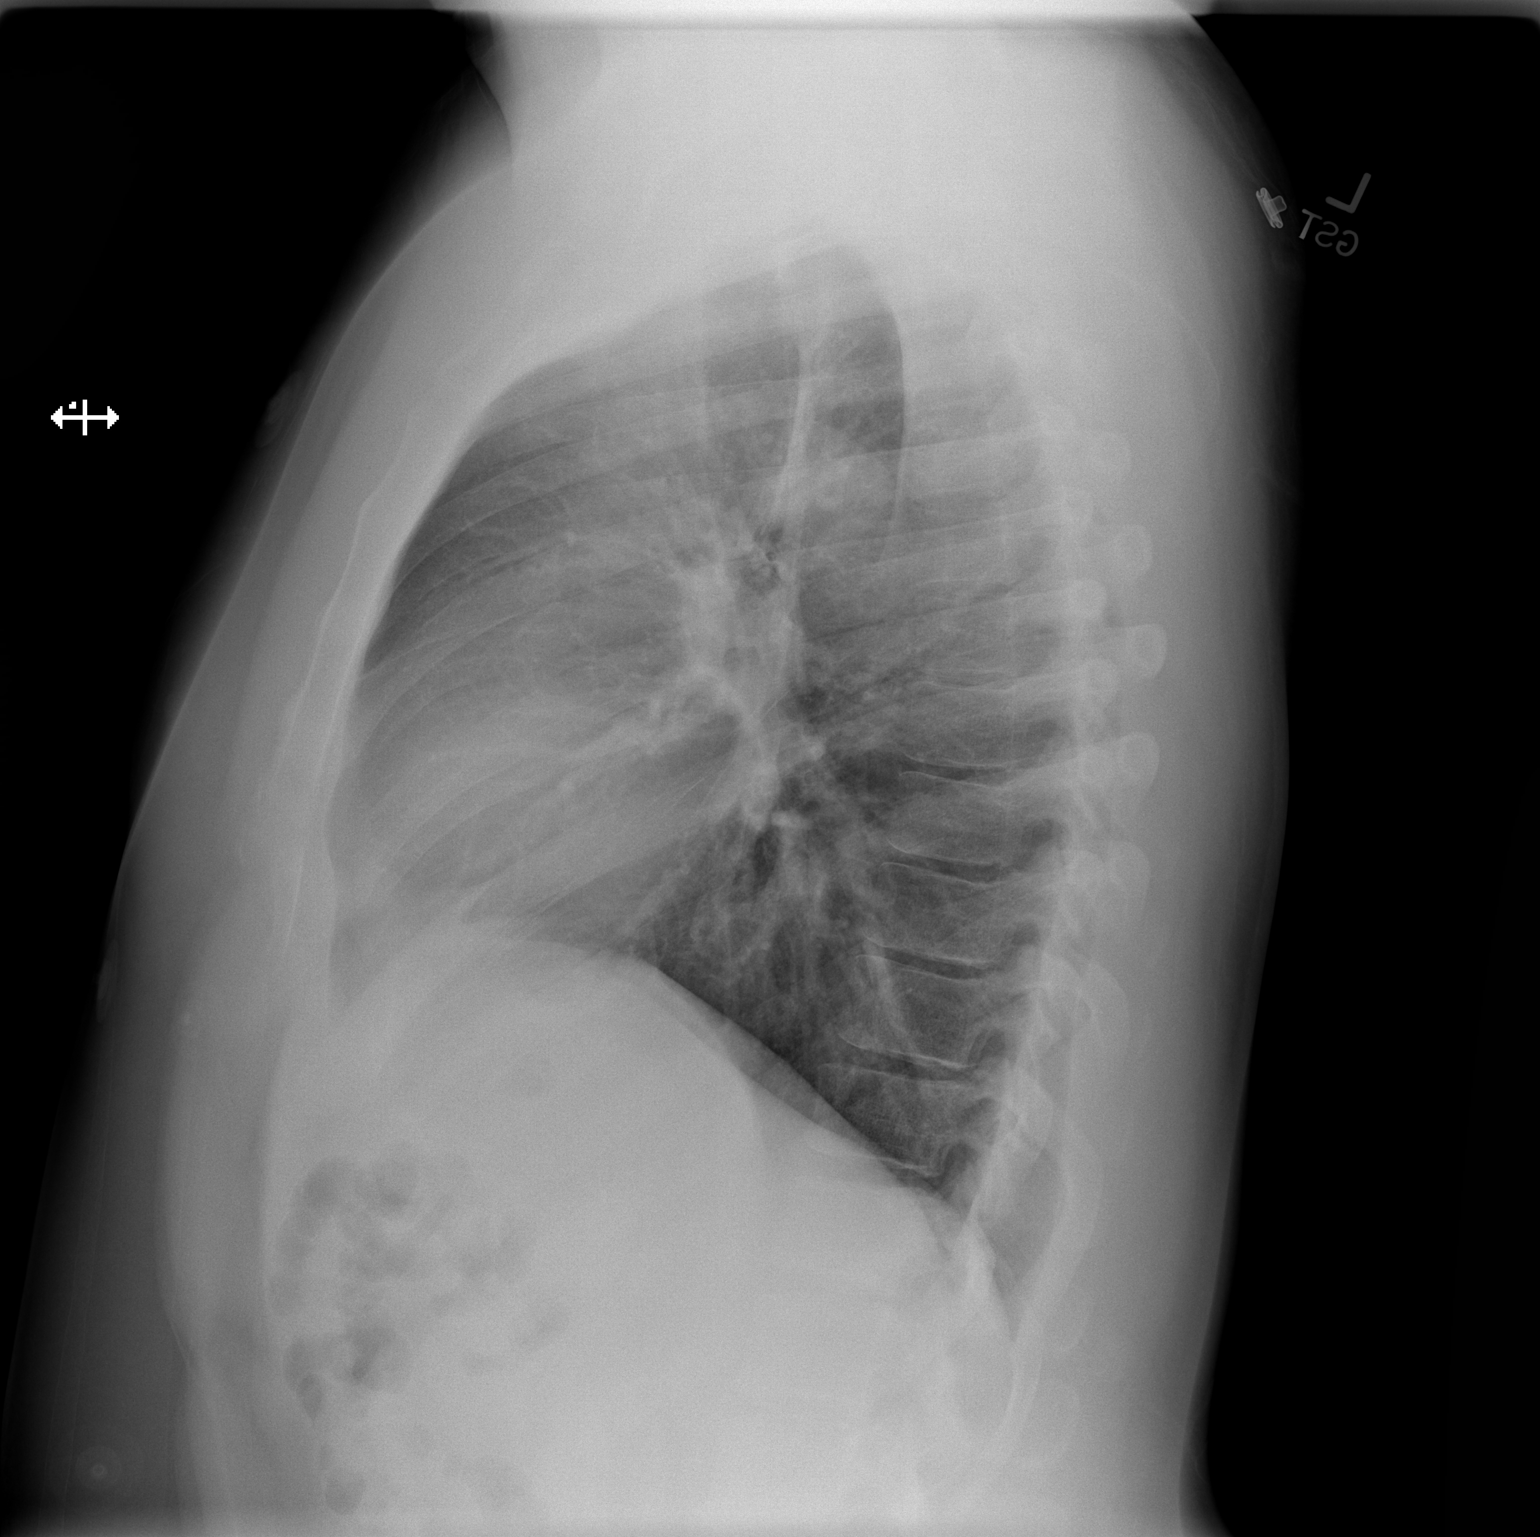

[2 of 2 positions shown; findings below may reference images not displayed]

FINDINGS: Normal heart size and pulmonary vascularity. No focal airspace
disease or consolidation in the lungs. No blunting of costophrenic
angles. No pneumothorax. Mediastinal contours appear intact. Old
fracture deformity of the right distal clavicle. No change since
prior study.
IMPRESSION: No active cardiopulmonary disease.

## 2017-07-20 ENCOUNTER — Encounter (HOSPITAL_COMMUNITY): Payer: Self-pay

## 2017-07-20 ENCOUNTER — Emergency Department (HOSPITAL_COMMUNITY)
Admission: EM | Admit: 2017-07-20 | Discharge: 2017-07-20 | Disposition: A | Discharge status: Home / Self Care | Payer: Self-pay | Attending: Emergency Medicine | Admitting: Emergency Medicine

## 2017-07-20 ENCOUNTER — Other Ambulatory Visit: Payer: Self-pay

## 2017-07-20 DIAGNOSIS — J45909 Unspecified asthma, uncomplicated: Secondary | ICD-10-CM | POA: Insufficient documentation

## 2017-07-20 DIAGNOSIS — R69 Illness, unspecified: Secondary | ICD-10-CM

## 2017-07-20 DIAGNOSIS — F17210 Nicotine dependence, cigarettes, uncomplicated: Secondary | ICD-10-CM | POA: Insufficient documentation

## 2017-07-20 DIAGNOSIS — J111 Influenza due to unidentified influenza virus with other respiratory manifestations: Secondary | ICD-10-CM | POA: Insufficient documentation

## 2017-07-20 NOTE — ED Provider Notes (Signed)
[facility]   CSN: [id number] Arrival date & time: 07/20/17  [id number]     History   Chief Complaint No chief complaint on file.   HPI Joel Ross is a 32 y.o. male.  HPI Patient is a 32 year old male who presents the emergency department 2 days of fever and myalgias and chills.  No significant cough.  Reports diarrhea yesterday.  No nausea or vomiting at this time.  No more diarrhea today.  Patient with similar recent sick contacts as his son was sick.  No sore throat.  No ear pain.  Symptoms are mild in severity.  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    Past Medical History:  Diagnosis Date  . Acid reflux   . Asthma    childhood    Patient Active Problem List   Diagnosis Date Noted  . GERD (gastroesophageal reflux disease) 11/08/2013  . Other dysphagia 11/08/2013    Past Surgical History:  Procedure Laterality Date  . neg hx         Home Medications    Prior to Admission medications   Medication Sig Start Date End Date Taking? Authorizing Provider  pantoprazole (PROTONIX) 20 MG tablet Take 2 tablets (40 mg total) by mouth 2 (two) times daily. 03/28/14   Tomasita Crumbleni, Adeleke, MD    Family History Family History  Problem Relation Age of Onset  . Heart attack Father [id number]       died  . Diabetes Father   . Diabetes Unknown        both sides of fx  . Colon cancer Neg Hx     Social History Social History   Tobacco Use  . Smoking status: Current Every Day Smoker    Packs/day: 1.00    Years: 10.00    Pack years: 10.00    Types: Cigarettes  . Smokeless tobacco: Never Used  . Tobacco comment: Tobacco info given 11/07/13  Substance Use Topics  . Alcohol use: Yes    Comment: occasion  . Drug use: Yes    Types: Marijuana, Cocaine     Allergies   Patient has no known allergies.   Review of Systems Review of Systems  All other systems  reviewed and are negative.    Physical Exam Updated Vital Signs BP 123/86 (BP Location: Right Arm)   Pulse 80   Temp 98.7 F (37.1 C) (Oral)   Resp 16   SpO2 99%   Physical Exam  Constitutional: He is oriented to person, place, and time. He appears well-developed and well-nourished.  HENT:  Head: Normocephalic and atraumatic.  Posterior pharynx normal.  Uvula midline.  Eyes: EOM are normal.  Neck: Normal range of motion.  Cardiovascular: Normal rate, regular rhythm, normal heart sounds and intact distal pulses.  Pulmonary/Chest: Effort normal and breath sounds normal. No respiratory distress.  Abdominal: Soft. He exhibits no distension. There is no tenderness.  Musculoskeletal: Normal range of motion.  Neurological: He is alert and oriented to person, place, and time.  Skin: Skin is warm and dry.  Psychiatric: He has a normal mood and affect. Judgment normal.  Nursing note and vitals reviewed.    ED Treatments / Results  Labs (all labs ordered are listed, but only abnormal results are displayed) Labs Reviewed - No data to display  EKG  EKG Interpretation None       Radiology No  results found.  Procedures Procedures (including critical care time)  Medications Ordered in ED Medications - No data to display   Initial Impression / Assessment and Plan / ED Course  I have reviewed the triage vital signs and the nursing notes.  Pertinent labs & imaging results that were available during my care of the patient were reviewed by me and considered in my medical decision making (see chart for details).     Overall well-appearing.  Influenza-like illness.  Discharged home with symptomatic management.  Work note given.  She understands to return to the ER for new or worsening symptoms.  Nontoxic.  Vitals stable  Final Clinical Impressions(s) / ED Diagnoses   Final diagnoses:  Influenza-like illness    ED Discharge Orders    None       Azalia Bilis,  MD 07/20/17 [phone]

## 2017-07-20 NOTE — ED Triage Notes (Signed)
Patient complains of 2 days of fever and bodyaches with chills. Cold symptoms, NAD

## 2019-12-28 ENCOUNTER — Emergency Department (HOSPITAL_COMMUNITY)
Admission: EM | Admit: 2019-12-28 | Discharge: 2019-12-29 | Disposition: A | Discharge status: Home / Self Care | Payer: Self-pay | Attending: Emergency Medicine | Admitting: Emergency Medicine

## 2019-12-28 ENCOUNTER — Encounter (HOSPITAL_COMMUNITY): Payer: Self-pay | Admitting: Emergency Medicine

## 2019-12-28 ENCOUNTER — Other Ambulatory Visit: Payer: Self-pay

## 2019-12-28 DIAGNOSIS — J45909 Unspecified asthma, uncomplicated: Secondary | ICD-10-CM | POA: Insufficient documentation

## 2019-12-28 DIAGNOSIS — M5431 Sciatica, right side: Secondary | ICD-10-CM | POA: Insufficient documentation

## 2019-12-28 DIAGNOSIS — F17210 Nicotine dependence, cigarettes, uncomplicated: Secondary | ICD-10-CM | POA: Insufficient documentation

## 2019-12-28 NOTE — ED Triage Notes (Signed)
Patient c/o right lower back pain x4 days after standing for a long period of time. Denies changes in bowel and bladder. States pain worsens with movement. Ambulatory.

## 2019-12-29 MED ORDER — IBUPROFEN 600 MG PO TABS: 600.0000 mg | ORAL_TABLET | Freq: Four times a day (QID) | ORAL | 0 refills | Status: AC | PRN

## 2019-12-29 MED ORDER — KETOROLAC TROMETHAMINE 60 MG/2ML IM SOLN
30.0000 mg | Freq: Once | INTRAMUSCULAR | Status: AC
  Administered 2019-12-29: 30 mg via INTRAMUSCULAR
  Filled 2019-12-29: qty 2

## 2019-12-29 MED ORDER — METHOCARBAMOL 500 MG PO TABS: 500.0000 mg | ORAL_TABLET | Freq: Two times a day (BID) | ORAL | 0 refills | Status: AC

## 2019-12-29 NOTE — ED Provider Notes (Signed)
[facility]-EMERGENCY DEPT Provider Note   CSN: [id number] Arrival date & time: 12/28/19  2023     History Chief Complaint  Patient presents with  . Back Pain    Joel Ross is a 34 y.o. male with a history of pediatric asthma and GERD who presents to the emergency department with a chief complaint of back pain.  The patient reports that he was standing cooking dinner for his children 5 at nights ago when he suddenly developed some pain in his right back.  The pain radiates down his right buttock and into his right leg.  He reports the pain has been constant, but has been waxing and waning since onset.  Pain has been constantly achy, but will become sharp with some positional changes.  He reports the pain is worse when he reclines.  He denies numbness, weakness, saddle paresthesias, urinary or fecal incontinence, fever, chills, abdominal pain, nausea, vomiting, diarrhea, rectal pain, penile or testicular pain or swelling.  No history of back surgery.  No recent falls or injuries.  He cannot recall any specific instances where he twisted or lifted any heavy objects recently.  He has been taking Goody powder since yesterday and applying a heating pad with minimal improvement in his symptoms.  No history of cancer.  Denies IV drug use.  The history is provided by the patient. No language interpreter was used.       Past Medical History:  Diagnosis Date  . Acid reflux   . Asthma    childhood    Patient Active Problem List   Diagnosis Date Noted  . GERD (gastroesophageal reflux disease) 11/08/2013  . Other dysphagia 11/08/2013    Past Surgical History:  Procedure Laterality Date  . neg hx         Family History  Problem Relation Age of Onset  . Heart attack Father 70       died  . Diabetes Father   . Diabetes Other        both sides of fx  . Colon cancer Neg Hx     Social History   Tobacco Use  . Smoking status: Current Every Day Smoker     Packs/day: 1.00    Years: 10.00    Pack years: 10.00    Types: Cigarettes  . Smokeless tobacco: Never Used  . Tobacco comment: Tobacco info given 11/07/13  Substance Use Topics  . Alcohol use: Yes    Comment: occasion  . Drug use: Yes    Types: Marijuana, Cocaine    Home Medications Prior to Admission medications   Medication Sig Start Date End Date Taking? Authorizing Provider  ibuprofen (ADVIL) 600 MG tablet Take 1 tablet (600 mg total) by mouth every 6 (six) hours as needed. 12/29/19   Bianca Vester A, PA-C  methocarbamol (ROBAXIN) 500 MG tablet Take 1 tablet (500 mg total) by mouth 2 (two) times daily. 12/29/19   Yarelin Reichardt A, PA-C  pantoprazole (PROTONIX) 20 MG tablet Take 2 tablets (40 mg total) by mouth 2 (two) times daily. 03/28/14   Tomasita Crumble, MD    Allergies    Patient has no known allergies.  Review of Systems   Review of Systems  Constitutional: Negative for appetite change, chills, diaphoresis and fever.  Respiratory: Negative for cough, shortness of breath and wheezing.   Cardiovascular: Negative for chest pain, palpitations and leg swelling.  Gastrointestinal: Negative for abdominal pain, blood in stool, diarrhea, nausea and vomiting.  Genitourinary:  Negative for discharge, dysuria, flank pain, frequency, penile pain, penile swelling, scrotal swelling and urgency.  Musculoskeletal: Positive for arthralgias, back pain and myalgias. Negative for gait problem, joint swelling, neck pain and neck stiffness.  Skin: Negative for color change, rash and wound.  Allergic/Immunologic: Negative for immunocompromised state.  Neurological: Negative for dizziness, seizures, syncope, weakness, numbness and headaches.  Psychiatric/Behavioral: Negative for confusion.    Physical Exam Updated Vital Signs BP (!) 134/96 (BP Location: Right Arm)   Pulse 92   Temp 98.8 F (37.1 C) (Oral)   Resp 16   Ht 5\' 5"  (1.651 m)   Wt 99.8 kg   SpO2 100%   BMI 36.61 kg/m   Physical  Exam Vitals and nursing note reviewed.  Constitutional:      Appearance: He is well-developed. He is obese. He is not ill-appearing or toxic-appearing.  HENT:     Head: Normocephalic.  Eyes:     Conjunctiva/sclera: Conjunctivae normal.  Cardiovascular:     Rate and Rhythm: Normal rate and regular rhythm.     Pulses: Normal pulses.     Heart sounds: Normal heart sounds. No murmur heard.  No friction rub. No gallop.   Pulmonary:     Effort: Pulmonary effort is normal. No respiratory distress.     Breath sounds: No wheezing, rhonchi or rales.  Chest:     Chest wall: No tenderness.  Abdominal:     General: There is no distension.     Palpations: Abdomen is soft.     Tenderness: There is no abdominal tenderness. There is no right CVA tenderness or left CVA tenderness.  Musculoskeletal:     Cervical back: Neck supple.     Comments: Spine is nontender.  No crepitus or step-offs.  No paraspinal muscle tenderness.  He is tender to palpation over the right SI joint.  Pelvis is stable.  5 of 5 strength against resistance with dorsiflexion plantarflexion as well as 5-5 strength of all of the large muscle groups of the bilateral lower extremities.  DP and PT pulses are 2+ and symmetric.  Sensation is intact and equal throughout.  Gait is unremarkable.  No focal tenderness palpation to the right hip, knee, or ankle.  Normal exam of the left lower extremity.  Skin:    General: Skin is warm and dry.  Neurological:     Mental Status: He is alert.  Psychiatric:        Behavior: Behavior normal.     ED Results / Procedures / Treatments   Labs (all labs ordered are listed, but only abnormal results are displayed) Labs Reviewed - No data to display  EKG None  Radiology No results found.  Procedures Procedures (including critical care time)  Medications Ordered in ED Medications  ketorolac (TORADOL) injection 30 mg (30 mg Intramuscular Given 12/29/19 0124)    ED Course  I have reviewed  the triage vital signs and the nursing notes.  Pertinent labs & imaging results that were available during my care of the patient were reviewed by me and considered in my medical decision making (see chart for details).    MDM Rules/Calculators/A&P                          Patient with back pain, mechanical in nature.  No neurological deficits and normal neuro exam.  Patient can walk but states it is painful.  No urinary or bowel incontinence.  No concern for cauda  equina, infection, AAA, infection, or fracture.  No constitutional symptoms including fever, chills, or weight loss,  No h/o cancer, IVDU.  Will discharge to home with RICE protocol and anti-inflammatory medicine. Discussed ED return precaution and indications for PCP follow up. The patient acknowledges the plan and is agreeable at this time.   Final Clinical Impression(s) / ED Diagnoses Final diagnoses:  Sciatica of right side    Rx / DC Orders ED Discharge Orders         Ordered    methocarbamol (ROBAXIN) 500 MG tablet  2 times daily     Discontinue  Reprint     12/29/19 0117    ibuprofen (ADVIL) 600 MG tablet  Every 6 hours PRN     Discontinue  Reprint     12/29/19 0117           Lakendrick Paradis, Coral Else, PA-C 12/29/19 0125    Palumbo, April, MD 12/29/19 0210

## 2019-12-29 NOTE — Discharge Instructions (Signed)
Thank you for allowing me to care for you today in the Emergency Department.   You were seen today for back pain.  Your symptoms are concerning for sciatica.  Take 650 mg of Tylenol or 600 mg of ibuprofen with food every 6 hours for pain.  You can alternate between these 2 medications every 3 hours if your pain returns.  For instance, you can take Tylenol at noon, followed by a dose of ibuprofen at 3, followed by second dose of Tylenol and 6.  You can use ice packs and apply them to areas that are sore for 15 to 20 minutes up to 3-4 times a day for the next 5 days.  After 72 hours, you can alternate between ice and heat.  Take 1 tablet of Robaxin up to 2 times daily.  Do not work or drive until you know how this medication impacts you as it may make you drowsy.  Do not take with other substances or medications that may make you drowsy.  Lidocaine patches are available over-the-counter and can be used as prescribed on the label.  Do not use heating pads when a lidocaine patch is a place as it may cause burns to the skin.  Use the attached stretches to stretch as your pain allows to improve your symptoms.  Call the number on your discharge paperwork to follow-up with sports medicine (Dr. Sherlean Foot) if your symptoms do not significantly improve with the regimen above.  Return to the emergency department if you begin peeing or pooping on yourself, develop worsening pain with high fevers, uncontrollable vomiting, tingling or numbness in your groin or legs, weakness, new swelling or pain tear penis or testicles, or other new, concerning symptoms.

## 2022-04-11 ENCOUNTER — Encounter (HOSPITAL_BASED_OUTPATIENT_CLINIC_OR_DEPARTMENT_OTHER): Payer: Self-pay

## 2022-04-11 ENCOUNTER — Emergency Department (HOSPITAL_BASED_OUTPATIENT_CLINIC_OR_DEPARTMENT_OTHER): Payer: Self-pay

## 2022-04-11 ENCOUNTER — Other Ambulatory Visit: Payer: Self-pay

## 2022-04-11 ENCOUNTER — Emergency Department (HOSPITAL_BASED_OUTPATIENT_CLINIC_OR_DEPARTMENT_OTHER)
Admission: EM | Admit: 2022-04-11 | Discharge: 2022-04-11 | Disposition: A | Discharge status: Home / Self Care | Payer: Self-pay | Attending: Emergency Medicine | Admitting: Emergency Medicine

## 2022-04-11 DIAGNOSIS — M5126 Other intervertebral disc displacement, lumbar region: Secondary | ICD-10-CM | POA: Insufficient documentation

## 2022-04-11 DIAGNOSIS — M5136 Other intervertebral disc degeneration, lumbar region: Secondary | ICD-10-CM

## 2022-04-11 DIAGNOSIS — M5432 Sciatica, left side: Secondary | ICD-10-CM

## 2022-04-11 DIAGNOSIS — M51369 Other intervertebral disc degeneration, lumbar region without mention of lumbar back pain or lower extremity pain: Secondary | ICD-10-CM

## 2022-04-11 DIAGNOSIS — M5442 Lumbago with sciatica, left side: Secondary | ICD-10-CM | POA: Insufficient documentation

## 2022-04-11 MED ORDER — PREDNISONE 50 MG PO TABS
50.0000 mg | ORAL_TABLET | Freq: Once | ORAL | Status: AC
  Administered 2022-04-11: 50 mg via ORAL
  Filled 2022-04-11: qty 1

## 2022-04-11 MED ORDER — PREDNISONE 50 MG PO TABS: 50.0000 mg | ORAL_TABLET | Freq: Every day | ORAL | 0 refills | Status: AC

## 2022-04-11 MED ORDER — OXYCODONE HCL 5 MG PO TABS: 5.0000 mg | ORAL_TABLET | ORAL | 0 refills | Status: AC | PRN

## 2022-04-11 MED ORDER — METHYLPREDNISOLONE SODIUM SUCC 125 MG IJ SOLR
125.0000 mg | Freq: Once | INTRAMUSCULAR | Status: AC
  Administered 2022-04-11: 125 mg via INTRAMUSCULAR
  Filled 2022-04-11: qty 2

## 2022-04-11 MED ORDER — METHYLPREDNISOLONE SODIUM SUCC 125 MG IJ SOLR: 125.0000 mg | Freq: Once | INTRAMUSCULAR | Status: DC

## 2022-04-11 MED ORDER — KETOROLAC TROMETHAMINE 30 MG/ML IJ SOLN
30.0000 mg | Freq: Once | INTRAMUSCULAR | Status: AC
  Administered 2022-04-11: 30 mg via INTRAMUSCULAR
  Filled 2022-04-11: qty 1

## 2022-04-11 NOTE — ED Triage Notes (Signed)
Pt c/o lower back pain for the past couple of days. Pain originated in the right lower back, now it is on the left and does radiate down his leg. Denies any injury or other associated symptoms.
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
## 2022-06-19 ENCOUNTER — Other Ambulatory Visit: Payer: Self-pay

## 2022-06-19 ENCOUNTER — Emergency Department (HOSPITAL_BASED_OUTPATIENT_CLINIC_OR_DEPARTMENT_OTHER)
Admission: EM | Admit: 2022-06-19 | Discharge: 2022-06-19 | Disposition: A | Discharge status: Home / Self Care | Payer: Self-pay | Attending: Emergency Medicine | Admitting: Emergency Medicine

## 2022-06-19 ENCOUNTER — Encounter (HOSPITAL_BASED_OUTPATIENT_CLINIC_OR_DEPARTMENT_OTHER): Payer: Self-pay | Admitting: Emergency Medicine

## 2022-06-19 DIAGNOSIS — J069 Acute upper respiratory infection, unspecified: Secondary | ICD-10-CM | POA: Insufficient documentation

## 2022-06-19 DIAGNOSIS — F17200 Nicotine dependence, unspecified, uncomplicated: Secondary | ICD-10-CM | POA: Insufficient documentation

## 2022-06-19 DIAGNOSIS — Z1152 Encounter for screening for COVID-19: Secondary | ICD-10-CM | POA: Insufficient documentation

## 2022-06-19 DIAGNOSIS — R030 Elevated blood-pressure reading, without diagnosis of hypertension: Secondary | ICD-10-CM | POA: Insufficient documentation

## 2022-06-19 LAB — RESP PANEL BY RT-PCR (RSV, FLU A&B, COVID)  RVPGX2
Influenza A by PCR: NEGATIVE
Influenza B by PCR: NEGATIVE
Resp Syncytial Virus by PCR: NEGATIVE
SARS Coronavirus 2 by RT PCR: NEGATIVE

## 2022-06-19 NOTE — Discharge Instructions (Addendum)
Follow up in your mychart account for your test results this afternoon. Motrin and Tylenol as needed as directed for pain. Coricidin HBP as needed as directed, this is available over the counter. Recheck with your doctor if not improving.   If your COVID test is positive, stay home and Quarantine until 06/21/22, you may then return to work with a mask on until 06/26/22.

## 2022-06-19 NOTE — ED Notes (Signed)
Discharge instructions, follow up care, and OTC meds reviewed and explained, pt verbalized understanding.Pt caox4 and ambulatory on d/c.

## 2022-06-19 NOTE — ED Provider Notes (Signed)
[facility] EMERGENC[facility] DEPT Provider Note   CSN: [id number] Arrival date & time: 06/19/22  2409     History  No chief complaint on file.   Joel Ross is a 37 y.o. male.  36 year old male with URI symptoms x 3 days with cough, congestion, body aches, chills, diarrhea. Daily smoker, no history of chronic lung disease, no history of HTN. Last took Theraflu at 2am today.        Home Medications Prior to Admission medications   Medication Sig Start Date End Date Taking? Authorizing Provider  ibuprofen (ADVIL) 600 MG tablet Take 1 tablet (600 mg total) by mouth every 6 (six) hours as needed. 12/29/19   McDonald, Mia A, PA-C  methocarbamol (ROBAXIN) 500 MG tablet Take 1 tablet (500 mg total) by mouth 2 (two) times daily. 12/29/19   McDonald, Mia A, PA-C  oxyCODONE (ROXICODONE) 5 MG immediate release tablet Take 1 tablet (5 mg total) by mouth every 4 (four) hours as needed for severe pain. 73/5/32   Campbell Stall P, DO  pantoprazole (PROTONIX) 20 MG tablet Take 2 tablets (40 mg total) by mouth 2 (two) times daily. 03/28/14   Everlene Balls, MD      Allergies    Patient has no known allergies.    Review of Systems   Review of Systems Negative except as per HPI Physical Exam Updated Vital Signs BP 127/86   Pulse 76   Temp 98.4 F (36.9 C) (Oral)   Resp 16   Ht 5\' 6"  (1.676 m)   Wt 93 kg   SpO2 97%   BMI 33.09 kg/m  Physical Exam Vitals and nursing note reviewed.  Constitutional:      General: He is not in acute distress.    Appearance: He is well-developed. He is not diaphoretic.  HENT:     Head: Normocephalic and atraumatic.     Right Ear: Tympanic membrane and ear canal normal.     Left Ear: Tympanic membrane and ear canal normal.     Nose: Congestion present.     Mouth/Throat:     Mouth: Mucous membranes are moist.     Pharynx: Posterior oropharyngeal erythema present.  Eyes:     Conjunctiva/sclera: Conjunctivae normal.  Cardiovascular:     Rate  and Rhythm: Normal rate and regular rhythm.     Heart sounds: Normal heart sounds.  Pulmonary:     Effort: Pulmonary effort is normal.     Breath sounds: Normal breath sounds.  Musculoskeletal:     Cervical back: Neck supple.  Lymphadenopathy:     Cervical: No cervical adenopathy.  Skin:    General: Skin is warm and dry.     Findings: No erythema or rash.  Neurological:     Mental Status: He is alert and oriented to person, place, and time.  Psychiatric:        Behavior: Behavior normal.     ED Results / Procedures / Treatments   Labs (all labs ordered are listed, but only abnormal results are displayed) Labs Reviewed  RESP PANEL BY RT-PCR (RSV, FLU A&B, COVID)  RVPGX2    EKG None  Radiology No results found.  Procedures Procedures    Medications Ordered in ED Medications - No data to display  ED Course/ Medical Decision Making/ A&P                           Medical Decision Making  36  yo male with URI symptoms + diarrhea as above. No known sick contacts. Daily smoker. Vitals reviewed, afebrile, BP elevated possibly related to underlying HTN vs elevated BP due to OTC cold medication. Lungs CTA. Mild oropharyngeal erythema without exudate, no cervical Lns. Nasal congestion. Discussed with patient- recommend Coricidin HBP and monitor BP. Discussed smoking cessation. Recommend Motrin and Tylenol for chills and body aches. Suspect viral URI. Follow up in MyChart for test results later today, provided with work note and quarantine guidelines is COVID +.         Final Clinical Impression(s) / ED Diagnoses Final diagnoses:  Viral URI with cough    Rx / DC Orders ED Discharge Orders     None         Tacy Learn, PA-C 97/41/63 [id number]    Lianne Cure, DO 64/68/03 1601

## 2022-06-19 NOTE — ED Triage Notes (Signed)
Cough , bodyaches since yesterday , h/a

## 2023-12-04 ENCOUNTER — Encounter (HOSPITAL_BASED_OUTPATIENT_CLINIC_OR_DEPARTMENT_OTHER): Payer: Self-pay

## 2023-12-04 ENCOUNTER — Other Ambulatory Visit: Payer: Self-pay

## 2023-12-04 ENCOUNTER — Emergency Department (HOSPITAL_BASED_OUTPATIENT_CLINIC_OR_DEPARTMENT_OTHER): Payer: Self-pay

## 2023-12-04 ENCOUNTER — Emergency Department (HOSPITAL_BASED_OUTPATIENT_CLINIC_OR_DEPARTMENT_OTHER): Payer: Self-pay | Admitting: Radiology

## 2023-12-04 ENCOUNTER — Emergency Department (HOSPITAL_BASED_OUTPATIENT_CLINIC_OR_DEPARTMENT_OTHER)
Admission: EM | Admit: 2023-12-04 | Discharge: 2023-12-04 | Disposition: A | Discharge status: Home / Self Care | Payer: Self-pay | Attending: Emergency Medicine | Admitting: Emergency Medicine

## 2023-12-04 DIAGNOSIS — J189 Pneumonia, unspecified organism: Secondary | ICD-10-CM

## 2023-12-04 DIAGNOSIS — R112 Nausea with vomiting, unspecified: Secondary | ICD-10-CM

## 2023-12-04 DIAGNOSIS — E876 Hypokalemia: Secondary | ICD-10-CM | POA: Insufficient documentation

## 2023-12-04 DIAGNOSIS — J181 Lobar pneumonia, unspecified organism: Secondary | ICD-10-CM | POA: Insufficient documentation

## 2023-12-04 DIAGNOSIS — J45909 Unspecified asthma, uncomplicated: Secondary | ICD-10-CM | POA: Insufficient documentation

## 2023-12-04 DIAGNOSIS — R059 Cough, unspecified: Secondary | ICD-10-CM | POA: Insufficient documentation

## 2023-12-04 LAB — URINALYSIS, ROUTINE W REFLEX MICROSCOPIC
Bacteria, UA: NONE SEEN
Bilirubin Urine: NEGATIVE
Glucose, UA: NEGATIVE mg/dL
Ketones, ur: NEGATIVE mg/dL
Leukocytes,Ua: NEGATIVE
Nitrite: NEGATIVE
Specific Gravity, Urine: 1.035 — ABNORMAL HIGH (ref 1.005–1.030)
pH: 6 (ref 5.0–8.0)

## 2023-12-04 LAB — RESP PANEL BY RT-PCR (RSV, FLU A&B, COVID)  RVPGX2
Influenza A by PCR: NEGATIVE
Influenza B by PCR: NEGATIVE
Resp Syncytial Virus by PCR: NEGATIVE
SARS Coronavirus 2 by RT PCR: NEGATIVE

## 2023-12-04 LAB — COMPREHENSIVE METABOLIC PANEL WITH GFR
ALT: 23 U/L (ref 0–44)
AST: 25 U/L (ref 15–41)
Albumin: 4 g/dL (ref 3.5–5.0)
Alkaline Phosphatase: 86 U/L (ref 38–126)
Anion gap: 10 (ref 5–15)
BUN: 12 mg/dL (ref 6–20)
CO2: 25 mmol/L (ref 22–32)
Calcium: 8.3 mg/dL — ABNORMAL LOW (ref 8.9–10.3)
Chloride: 103 mmol/L (ref 98–111)
Creatinine, Ser: 1.01 mg/dL (ref 0.61–1.24)
GFR, Estimated: >60 mL/min (ref >60)
Glucose, Bld: 104 mg/dL — ABNORMAL HIGH (ref 70–99)
Potassium: 3.4 mmol/L — ABNORMAL LOW (ref 3.5–5.1)
Sodium: 138 mmol/L (ref 135–145)
Total Bilirubin: 0.8 mg/dL (ref 0.0–1.2)
Total Protein: 7.1 g/dL (ref 6.5–8.1)

## 2023-12-04 LAB — CBC
HCT: 41.7 % (ref 39.0–52.0)
Hemoglobin: 14.5 g/dL (ref 13.0–17.0)
MCH: 29.5 pg (ref 26.0–34.0)
MCHC: 34.8 g/dL (ref 30.0–36.0)
MCV: 84.8 fL (ref 80.0–100.0)
Platelets: 250 10*3/uL (ref 150–400)
RBC: 4.92 MIL/uL (ref 4.22–5.81)
RDW: 12.5 % (ref 11.5–15.5)
WBC: 9.9 10*3/uL (ref 4.0–10.5)
nRBC: 0 % (ref 0.0–0.2)

## 2023-12-04 LAB — LIPASE, BLOOD: Lipase: 11 U/L (ref 11–51)

## 2023-12-04 MED ORDER — PANTOPRAZOLE SODIUM 40 MG IV SOLR
40.0000 mg | Freq: Once | INTRAVENOUS | Status: AC
  Administered 2023-12-04: 40 mg via INTRAVENOUS
  Filled 2023-12-04: qty 10

## 2023-12-04 MED ORDER — AMOXICILLIN-POT CLAVULANATE 875-125 MG PO TABS: 1.0000 | ORAL_TABLET | Freq: Two times a day (BID) | ORAL | 0 refills | Status: AC

## 2023-12-04 MED ORDER — ONDANSETRON 4 MG PO TBDP: 4.0000 mg | ORAL_TABLET | Freq: Three times a day (TID) | ORAL | 0 refills | Status: AC | PRN

## 2023-12-04 MED ORDER — IOHEXOL 300 MG/ML  SOLN
100.0000 mL | Freq: Once | INTRAMUSCULAR | Status: AC | PRN
Start: 2023-12-04 — End: 2023-12-04
  Administered 2023-12-04: 100 mL via INTRAVENOUS

## 2023-12-04 MED ORDER — MORPHINE SULFATE (PF) 4 MG/ML IV SOLN
4.0000 mg | Freq: Once | INTRAVENOUS | Status: AC
  Administered 2023-12-04: 4 mg via INTRAVENOUS
  Filled 2023-12-04: qty 1

## 2023-12-04 MED ORDER — ONDANSETRON HCL 4 MG/2ML IJ SOLN
4.0000 mg | Freq: Once | INTRAMUSCULAR | Status: AC
  Administered 2023-12-04: 4 mg via INTRAVENOUS
  Filled 2023-12-04: qty 2

## 2023-12-04 MED ORDER — DIPHENHYDRAMINE HCL 50 MG/ML IJ SOLN
12.5000 mg | Freq: Once | INTRAMUSCULAR | Status: AC
  Administered 2023-12-04: 12.5 mg via INTRAVENOUS
  Filled 2023-12-04: qty 1

## 2023-12-04 MED ORDER — METOCLOPRAMIDE HCL 5 MG/ML IJ SOLN
10.0000 mg | Freq: Once | INTRAMUSCULAR | Status: AC
  Administered 2023-12-04: 10 mg via INTRAVENOUS
  Filled 2023-12-04: qty 2

## 2023-12-04 MED ORDER — KETOROLAC TROMETHAMINE 15 MG/ML IJ SOLN: 15.0000 mg | Freq: Once | INTRAMUSCULAR | Status: DC

## 2023-12-04 MED ORDER — FAMOTIDINE 20 MG PO TABS: 20.0000 mg | ORAL_TABLET | Freq: Two times a day (BID) | ORAL | 0 refills | Status: AC

## 2023-12-04 MED ORDER — SODIUM CHLORIDE 0.9 % IV BOLUS
1000.0000 mL | Freq: Once | INTRAVENOUS | Status: AC
  Administered 2023-12-04: 1000 mL via INTRAVENOUS

## 2023-12-04 NOTE — ED Notes (Signed)
 DC paperwork given and verbally understood.

## 2023-12-04 NOTE — ED Notes (Addendum)
 Fluid given... Pt had N/V after the fluids/food... Provider aware.SABRASABRA

## 2023-12-04 NOTE — Discharge Instructions (Addendum)
I am glad you are feeling better. Please follow up with primary care provider. Seek emergency care if experiencing any new or worsening symptoms.

## 2023-12-04 NOTE — ED Triage Notes (Signed)
 In for eval of nausea, vomiting, diarrhea, and abd pain onset yesterday am. Felt hot to touch last night. Productive cough with thick yellow sputum. Sore throat. Denies nasal congestion. Clear bilateral breath sounds.

## 2023-12-04 NOTE — ED Provider Notes (Signed)
 [place] EMERGENCY DEPARTMENT AT [facility] Provider Note   CSN: [id number] Arrival date & time: 12/04/23  1056     Patient presents with: Emesis   Joel Ross is a 38 y.o. male with PMHx GERD, asthma who presents to ED concerned for nausea, vomiting, and diarrhea since yesterday morning. Patient also with epigastric abdominal pain. Denies sick contacts. Patient believes stomach upset could be d/t food poisoning since symptoms started after eating chicken wings. Patient also endorses 101F at home yesterday. No recent advil  or tylenol  use today.   Patient also with productive cough with yellow sputum and sore throat x1 month. Cough continues to worsen daily but patient has not sought medical care because he does not like going to doctor appointments.     Emesis      Prior to Admission medications   Medication Sig Start Date End Date Taking? Authorizing Provider  amoxicillin-clavulanate (AUGMENTIN) 875-125 MG tablet Take 1 tablet by mouth every 12 (twelve) hours for 7 days. 12/04/23 12/11/23 Yes Layann Bluett, Nidia F, PA-C  famotidine  (PEPCID ) 20 MG tablet Take 1 tablet (20 mg total) by mouth 2 (two) times daily for 14 days. 12/04/23 12/18/23 Yes Hoy Nidia F, PA-C  ondansetron  (ZOFRAN -ODT) 4 MG disintegrating tablet Take 1 tablet (4 mg total) by mouth every 8 (eight) hours as needed for nausea. 12/04/23  Yes Hoy Nidia F, PA-C  ibuprofen  (ADVIL ) 600 MG tablet Take 1 tablet (600 mg total) by mouth every 6 (six) hours as needed. 12/29/19   McDonald, Mia A, PA-C  methocarbamol  (ROBAXIN ) 500 MG tablet Take 1 tablet (500 mg total) by mouth 2 (two) times daily. 12/29/19   McDonald, Mia A, PA-C  oxyCODONE  (ROXICODONE ) 5 MG immediate release tablet Take 1 tablet (5 mg total) by mouth every 4 (four) hours as needed for severe pain. 04/11/22   Elnor Bernarda SQUIBB, DO  pantoprazole  (PROTONIX ) 20 MG tablet Take 2 tablets (40 mg total) by mouth 2 (two) times daily. 03/28/14   Carlota Day, MD    Allergies: Patient has no known allergies.    Review of Systems  Gastrointestinal:  Positive for vomiting.    Updated Vital Signs BP 113/75 (BP Location: Right Arm)   Pulse 65   Temp 98.2 F (36.8 C) (Oral)   Resp 18   Ht 5' 6 (1.676 m)   Wt 95.3 kg   SpO2 98%   BMI 33.89 kg/m   Physical Exam Vitals and nursing note reviewed.  Constitutional:      General: He is not in acute distress.    Appearance: He is not ill-appearing or toxic-appearing.  HENT:     Head: Normocephalic and atraumatic.     Mouth/Throat:     Mouth: Mucous membranes are moist.     Pharynx: Oropharynx is clear. No oropharyngeal exudate or posterior oropharyngeal erythema.     Comments: +1 tonsils BL. No swelling, erythema, or exudates  Eyes:     General: No scleral icterus.       Right eye: No discharge.        Left eye: No discharge.     Conjunctiva/sclera: Conjunctivae normal.    Cardiovascular:     Rate and Rhythm: Normal rate and regular rhythm.     Pulses: Normal pulses.     Heart sounds: Normal heart sounds. No murmur heard. Pulmonary:     Effort: Pulmonary effort is normal. No respiratory distress.     Breath sounds: No wheezing, rhonchi or rales.  Comments: Mildly distant breath sounds in right lung fields. Abdominal:     General: Abdomen is flat. There is no distension.     Palpations: Abdomen is soft. There is no mass.     Tenderness: There is abdominal tenderness.     Comments: Epigastric tenderness to palpation   Musculoskeletal:     Right lower leg: No edema.     Left lower leg: No edema.   Skin:    General: Skin is warm and dry.     Findings: No rash.   Neurological:     General: No focal deficit present.     Mental Status: He is alert and oriented to person, place, and time. Mental status is at baseline.   Psychiatric:        Mood and Affect: Mood normal.        Behavior: Behavior normal.     (all labs ordered are listed, but only abnormal  results are displayed) Labs Reviewed  COMPREHENSIVE METABOLIC PANEL WITH GFR - Abnormal; Notable for the following components:      Result Value   Potassium 3.4 (*)    Glucose, Bld 104 (*)    Calcium 8.3 (*)    All other components within normal limits  URINALYSIS, ROUTINE W REFLEX MICROSCOPIC - Abnormal; Notable for the following components:   Specific Gravity, Urine 1.035 (*)    Hgb urine dipstick SMALL (*)    Protein, ur TRACE (*)    All other components within normal limits  RESP PANEL BY RT-PCR (RSV, FLU A&B, COVID)  RVPGX2  LIPASE, BLOOD  CBC    EKG: None  Radiology: CT ABDOMEN PELVIS W CONTRAST Result Date: 12/04/2023 CLINICAL DATA:  Abdominal pain, acute, nonlocalized, nausea, vomiting EXAM: CT ABDOMEN AND PELVIS WITH CONTRAST TECHNIQUE: Multidetector CT imaging of the abdomen and pelvis was performed using the standard protocol following bolus administration of intravenous contrast. RADIATION DOSE REDUCTION: This exam was performed according to the departmental dose-optimization program which includes automated exposure control, adjustment of the mA and/or kV according to patient size and/or use of iterative reconstruction technique. CONTRAST:  OMNIPAQUE  IOHEXOL  300 MG/ML  SOLN COMPARISON:  01/25/2013 CT pelvis FINDINGS: Lower chest: No significant pulmonary nodules or acute consolidative airspace disease. Hepatobiliary: Normal liver size. No liver mass. Normal gallbladder with no radiopaque cholelithiasis. No biliary ductal dilatation. Pancreas: Normal, with no mass or duct dilation. Spleen: Normal size. No mass. Adrenals/Urinary Tract: Normal adrenals. Normal kidneys with no hydronephrosis and no contour deforming renal mass. Normal bladder. Stomach/Bowel: Normal non-distended stomach. Normal caliber small bowel with no small bowel wall thickening. Normal appendix. Normal large bowel with no diverticulosis, large bowel wall thickening or pericolonic fat stranding.  Vascular/Lymphatic: Normal caliber abdominal aorta. Patent portal, splenic, hepatic and renal veins. No pathologically enlarged lymph nodes in the abdomen or pelvis. Reproductive: Normal size prostate. Other: No pneumoperitoneum, ascites or focal fluid collection. Musculoskeletal: No aggressive appearing focal osseous lesions. IMPRESSION: No acute abnormality. Normal appendix. Electronically Signed   By: Selinda DELENA Blue M.D.   On: 12/04/2023 13:42   DG Chest 2 View Result Date: 12/04/2023 CLINICAL DATA:  Productive cough. EXAM: CHEST - 2 VIEW COMPARISON:  March 28, 2014. FINDINGS: The heart size and mediastinal contours are within normal limits. Both lungs are clear. The visualized skeletal structures are unremarkable. IMPRESSION: No active cardiopulmonary disease. Electronically Signed   By: Lynwood Landy Raddle M.D.   On: 12/04/2023 13:36     Procedures   Medications Ordered  in the ED  ondansetron  (ZOFRAN ) injection 4 mg (4 mg Intravenous Given 12/04/23 1249)  pantoprazole  (PROTONIX ) injection 40 mg (40 mg Intravenous Given 12/04/23 1246)  morphine (PF) 4 MG/ML injection 4 mg (4 mg Intravenous Given 12/04/23 1250)  sodium chloride  0.9 % bolus 1,000 mL (0 mLs Intravenous Stopped 12/04/23 1307)  iohexol  (OMNIPAQUE ) 300 MG/ML solution 100 mL (100 mLs Intravenous Contrast Given 12/04/23 1224)  metoCLOPramide (REGLAN) injection 10 mg (10 mg Intravenous Given 12/04/23 1506)  diphenhydrAMINE (BENADRYL) injection 12.5 mg (12.5 mg Intravenous Given 12/04/23 1505)                                    Medical Decision Making Amount and/or Complexity of Data Reviewed Labs: ordered. Radiology: ordered.  Risk Prescription drug management.    This patient presents to the ED for concern of abdominal pain, this involves an extensive number of treatment options, and is a complaint that carries with it a high risk of complications and morbidity.  The differential diagnosis includes gastroenteritis, colitis, small  bowel obstruction, appendicitis, cholecystitis, pancreatitis, nephrolithiasis, UTI, pyleonephritis   Co morbidities that complicate the patient evaluation   GERD, asthma    Additional history obtained:  No PCP listed in chart  Problem List / ED Course / Critical interventions / Medication management  Patient presented for abdominal pain, nausea, vomiting, diarrhea since yesterday morning.  Of note, patient also complaining of worsening cough over the past 1 month.  Endorses fever of 101 yesterday.  Physical exam today with slightly decreased breath sounds in the right lung field along with epigastric tenderness to palpation.  Rest of physical exam reassuring.  Patient afebrile with stable vitals. I Ordered, and personally interpreted labs.  UA not concerning for infection.  CBC without leukocytosis or anemia.  CMP with mild hypokalemia at 3.4.  Lipase within normal limits.  Respiratory panel negative. I ordered imaging studies including CT Abd/Pelvis with contrast and chest x-ray.  I independently visualized and interpreted imaging and I agree with the radiologist interpretation of no acute process. Provided patient with Protonix , Zofran , morphine, and 1L IV fluids.  Patient still nauseous.  Provided patient with Reglan and Benadryl.  Patient now feels better and is able to tolerate PO. As for patient's worsening cough and mild adventitious lung sounds, I talked with patient who agrees to start antibiotic treatment for possible PNA. Patient stating that he has severe acid reflux that he suffers with daily - patient endorsing daily episodes of vomiting immediately after eating that has been happening for a while.  Will provide patient with a GI referral for follow-up. I have reviewed the patients home medicines and have made adjustments as needed The patient has been appropriately medically screened and/or stabilized in the ED. I have low suspicion for any other emergent medical condition which  would require further screening, evaluation or treatment in the ED or require inpatient management. At time of discharge the patient is hemodynamically stable and in no acute distress. I have discussed work-up results and diagnosis with patient and answered all questions. Patient is agreeable with discharge plan. We discussed strict return precautions for returning to the emergency department and they verbalized understanding.     Social Determinants of Health:  none       Final diagnoses:  Nausea and vomiting, unspecified vomiting type  Pneumonia of right lung due to infectious organism, unspecified part of lung  ED Discharge Orders          Ordered    amoxicillin-clavulanate (AUGMENTIN) 875-125 MG tablet  Every 12 hours        12/04/23 1633    ondansetron  (ZOFRAN -ODT) 4 MG disintegrating tablet  Every 8 hours PRN        12/04/23 1633    famotidine  (PEPCID ) 20 MG tablet  2 times daily        12/04/23 1633    Ambulatory referral to Gastroenterology       Comments: GERD with daily emesis   12/04/23 1634               Hoy Nidia FALCON, PA-C 12/04/23 1635    Ellouise Fine K, DO 12/05/23 [phone]

## 2024-02-17 ENCOUNTER — Encounter: Payer: Self-pay | Admitting: Emergency Medicine
